# Patient Record
Sex: Female | Born: 1960 | Race: White | Hispanic: No | Marital: Single | Smoking: Never smoker
Health system: Southern US, Community
[De-identification: ages and names within clinical notes are randomized; demographics above are authoritative.]

## PROBLEM LIST (undated history)

## (undated) DIAGNOSIS — F32A Depression, unspecified: Secondary | ICD-10-CM

## (undated) DIAGNOSIS — F329 Major depressive disorder, single episode, unspecified: Secondary | ICD-10-CM

## (undated) DIAGNOSIS — K760 Fatty (change of) liver, not elsewhere classified: Secondary | ICD-10-CM

## (undated) DIAGNOSIS — K219 Gastro-esophageal reflux disease without esophagitis: Secondary | ICD-10-CM

## (undated) DIAGNOSIS — R519 Headache, unspecified: Secondary | ICD-10-CM

## (undated) DIAGNOSIS — R55 Syncope and collapse: Secondary | ICD-10-CM

## (undated) DIAGNOSIS — F419 Anxiety disorder, unspecified: Secondary | ICD-10-CM

## (undated) DIAGNOSIS — Z87828 Personal history of other (healed) physical injury and trauma: Secondary | ICD-10-CM

## (undated) DIAGNOSIS — G4730 Sleep apnea, unspecified: Secondary | ICD-10-CM

## (undated) DIAGNOSIS — M539 Dorsopathy, unspecified: Secondary | ICD-10-CM

## (undated) DIAGNOSIS — M1990 Unspecified osteoarthritis, unspecified site: Secondary | ICD-10-CM

## (undated) DIAGNOSIS — R51 Headache: Secondary | ICD-10-CM

## (undated) HISTORY — PX: TONSILLECTOMY: SUR1361

## (undated) HISTORY — DX: Personal history of other (healed) physical injury and trauma: Z87.828

## (undated) HISTORY — DX: Syncope and collapse: R55

## (undated) HISTORY — PX: LAPAROSCOPIC OVARIAN CYSTECTOMY: SUR786

## (undated) HISTORY — PX: BREAST BIOPSY: SHX20

---

## 2014-02-07 ENCOUNTER — Ambulatory Visit: Payer: Self-pay | Admitting: Internal Medicine

## 2014-03-10 ENCOUNTER — Ambulatory Visit: Payer: Self-pay | Admitting: Unknown Physician Specialty

## 2014-03-14 LAB — PATHOLOGY REPORT

## 2015-02-09 ENCOUNTER — Other Ambulatory Visit: Payer: Self-pay | Admitting: Physician Assistant

## 2015-02-09 DIAGNOSIS — R110 Nausea: Secondary | ICD-10-CM

## 2015-02-09 DIAGNOSIS — R5381 Other malaise: Secondary | ICD-10-CM

## 2015-02-09 DIAGNOSIS — R5383 Other fatigue: Secondary | ICD-10-CM

## 2015-02-09 DIAGNOSIS — R1011 Right upper quadrant pain: Secondary | ICD-10-CM

## 2015-02-13 ENCOUNTER — Ambulatory Visit: Payer: Self-pay

## 2015-02-21 ENCOUNTER — Ambulatory Visit
Admission: RE | Admit: 2015-02-21 | Discharge: 2015-02-21 | Disposition: A | Discharge status: Home / Self Care | Payer: BLUE CROSS/BLUE SHIELD | Source: Ambulatory Visit | Attending: Physician Assistant | Admitting: Physician Assistant

## 2015-02-21 DIAGNOSIS — R5381 Other malaise: Secondary | ICD-10-CM

## 2015-02-21 DIAGNOSIS — R5383 Other fatigue: Secondary | ICD-10-CM

## 2015-02-21 DIAGNOSIS — R1011 Right upper quadrant pain: Secondary | ICD-10-CM | POA: Insufficient documentation

## 2015-02-21 DIAGNOSIS — K769 Liver disease, unspecified: Secondary | ICD-10-CM | POA: Insufficient documentation

## 2015-02-21 DIAGNOSIS — R110 Nausea: Secondary | ICD-10-CM | POA: Diagnosis present

## 2015-06-18 ENCOUNTER — Encounter: Payer: Self-pay | Admitting: Emergency Medicine

## 2015-06-18 DIAGNOSIS — R1013 Epigastric pain: Secondary | ICD-10-CM | POA: Diagnosis not present

## 2015-06-18 DIAGNOSIS — Z91040 Latex allergy status: Secondary | ICD-10-CM | POA: Diagnosis not present

## 2015-06-18 DIAGNOSIS — R1011 Right upper quadrant pain: Secondary | ICD-10-CM | POA: Insufficient documentation

## 2015-06-18 NOTE — ED Notes (Signed)
Patient ambulatory to triage with steady gait, without difficulty or distress noted; pt reports dx with fatty liver disease 87mo ago by PCP; c/o increasing upper right quad pain radiating into lower abdomen; pt denies any accomp symptoms

## 2015-06-19 ENCOUNTER — Emergency Department: Payer: BLUE CROSS/BLUE SHIELD

## 2015-06-19 ENCOUNTER — Emergency Department
Admission: EM | Admit: 2015-06-19 | Discharge: 2015-06-19 | Disposition: A | Discharge status: Home / Self Care | Payer: BLUE CROSS/BLUE SHIELD | Attending: Emergency Medicine | Admitting: Emergency Medicine

## 2015-06-19 DIAGNOSIS — R1013 Epigastric pain: Secondary | ICD-10-CM

## 2015-06-19 DIAGNOSIS — R1011 Right upper quadrant pain: Secondary | ICD-10-CM

## 2015-06-19 DIAGNOSIS — R109 Unspecified abdominal pain: Secondary | ICD-10-CM

## 2015-06-19 HISTORY — DX: Fatty (change of) liver, not elsewhere classified: K76.0

## 2015-06-19 LAB — COMPREHENSIVE METABOLIC PANEL
ALBUMIN: 4.1 g/dL (ref 3.5–5.0)
ALT: 23 U/L (ref 14–54)
ANION GAP: 9 (ref 5–15)
AST: 27 U/L (ref 15–41)
Alkaline Phosphatase: 81 U/L (ref 38–126)
BUN: 20 mg/dL (ref 6–20)
CHLORIDE: 105 mmol/L (ref 101–111)
CO2: 26 mmol/L (ref 22–32)
Calcium: 9.7 mg/dL (ref 8.9–10.3)
Creatinine, Ser: 0.78 mg/dL (ref 0.44–1.00)
GFR calc Af Amer: >60 mL/min (ref >60)
GFR calc non Af Amer: >60 mL/min (ref >60)
GLUCOSE: 117 mg/dL — AB (ref 65–99)
POTASSIUM: 3.4 mmol/L — AB (ref 3.5–5.1)
SODIUM: 140 mmol/L (ref 135–145)
TOTAL PROTEIN: 7.4 g/dL (ref 6.5–8.1)
Total Bilirubin: 0.1 mg/dL — ABNORMAL LOW (ref 0.3–1.2)

## 2015-06-19 LAB — URINALYSIS COMPLETE WITH MICROSCOPIC (ARMC ONLY)
BILIRUBIN URINE: NEGATIVE
GLUCOSE, UA: NEGATIVE mg/dL
Ketones, ur: NEGATIVE mg/dL
Leukocytes, UA: NEGATIVE
NITRITE: NEGATIVE
PH: 5 (ref 5.0–8.0)
Protein, ur: NEGATIVE mg/dL
Specific Gravity, Urine: 1.02 (ref 1.005–1.030)

## 2015-06-19 LAB — CBC
HEMATOCRIT: 43.5 % (ref 35.0–47.0)
HEMOGLOBIN: 14.7 g/dL (ref 12.0–16.0)
MCH: 29.1 pg (ref 26.0–34.0)
MCHC: 33.7 g/dL (ref 32.0–36.0)
MCV: 86.2 fL (ref 80.0–100.0)
Platelets: 227 10*3/uL (ref 150–440)
RBC: 5.04 MIL/uL (ref 3.80–5.20)
RDW: 12.3 % (ref 11.5–14.5)
WBC: 11.2 10*3/uL — ABNORMAL HIGH (ref 3.6–11.0)

## 2015-06-19 LAB — LIPASE, BLOOD: LIPASE: 19 U/L — AB (ref 22–51)

## 2015-06-19 LAB — TROPONIN I

## 2015-06-19 MED ORDER — GI COCKTAIL ~~LOC~~
30.0000 mL | Freq: Once | ORAL | Status: AC
  Administered 2015-06-19: 30 mL via ORAL
  Filled 2015-06-19: qty 30

## 2015-06-19 MED ORDER — SUCRALFATE 1 G PO TABS
1.0000 g | ORAL_TABLET | Freq: Two times a day (BID) | ORAL | Status: DC
Start: 2015-06-19 — End: 2015-08-08

## 2015-06-19 NOTE — ED Provider Notes (Signed)
[facility] Emergency Department Provider Note  ____________________________________________  Time seen: Approximately 245 AM  I have reviewed the triage vital signs and the nursing notes.   HISTORY  Chief Complaint Abdominal Pain    HPI Elizabeth Oliver is a 54 y.o. female who comes in with 3 weeks of abdominal pain. The patient reports that back in May she was told that she had a fatty liver after having 2 weeks of abdominal pain. The patient reports that it got better for 3 months. She reports that 3 weeks ago this pain started up again in her right upper quadrant with some burning in her upper abdomen and some occasional burning in her lower abdomen. The patient reports that sometime she has this right upper quadrant pain when she takes a deep breath as well. The patient is concerned about cirrhosis or cancer. She has an appointment with her primary care physician in the morning but reports that she was nervous she decided to come in for evaluation. The patient reports that the pain is occasionally worse after eating and she has not taken anything for the pain. The patient reports that sometimes it'll go away for a short amount of time but is not often. She reports the pain is currently a 6 out of 10 in intensity.   Past Medical History  Diagnosis Date  . Fatty liver     There are no active problems to display for this patient.   Past Surgical History  Procedure Laterality Date  . Tonsillectomy    . Laparoscopic ovarian cystectomy      Current Outpatient Rx  Name  Route  Sig  Dispense  Refill  . sucralfate (CARAFATE) 1 G tablet   Oral   Take 1 tablet (1 g total) by mouth 2 (two) times daily.   30 tablet   0     Allergies Latex  No family history on file.  Social History Social History  Substance Use Topics  . Smoking status: Not on file  . Smokeless tobacco: Not on file  . Alcohol Use: Not on file    Review of  Systems Constitutional: No fever/chills Eyes: No visual changes. ENT: No sore throat. Cardiovascular: Denies chest pain. Respiratory: Denies shortness of breath. Gastrointestinal: abdominal pain.  No nausea, no vomiting.  No diarrhea.  No constipation. Genitourinary: Negative for dysuria. Musculoskeletal: Negative for back pain. Skin: Negative for rash. Neurological: Negative for headaches, focal weakness or numbness.  10-point ROS otherwise negative.  ____________________________________________   PHYSICAL EXAM:  VITAL SIGNS: ED Triage Vitals  Enc Vitals Group     BP 06/18/15 2325 155/104 mmHg     Pulse Rate 06/18/15 2325 100     Resp --      Temp 06/18/15 2325 98.7 F (37.1 C)     Temp Source 06/18/15 2325 Oral     SpO2 06/18/15 2325 96 %     Weight 06/18/15 2325 221 lb (100.245 kg)     Height 06/18/15 2325 5\' 7"  (1.702 m)     Head Cir --      Peak Flow --      Pain Score 06/18/15 2325 6     Pain Loc --      Pain Edu? --      Excl. in [place]? --     Constitutional: Alert and oriented. Well appearing and in no acute distress. Eyes: Conjunctivae are normal. PERRL. EOMI. Head: Atraumatic. Nose: No congestion/rhinnorhea. Mouth/Throat: Mucous membranes are moist.  Oropharynx non-erythematous. Cardiovascular: Normal rate, regular rhythm. Grossly normal heart sounds.  Good peripheral circulation. Respiratory: Normal respiratory effort.  No retractions. Lungs CTAB. Gastrointestinal: Soft right upper quadrant tenderness to palpation. No distention. Active bowel sounds Musculoskeletal: No lower extremity tenderness nor edema.  No joint effusions. Neurologic:  Normal speech and language.  Skin:  Skin is warm, dry and intact. No rash noted. Psychiatric: Mood and affect are normal.   ____________________________________________   LABS (all labs ordered are listed, but only abnormal results are displayed)  Labs Reviewed  LIPASE, BLOOD - Abnormal; Notable for the following:     Lipase 19 (*)    All other components within normal limits  COMPREHENSIVE METABOLIC PANEL - Abnormal; Notable for the following:    Potassium 3.4 (*)    Glucose, Bld 117 (*)    Total Bilirubin 0.1 (*)    All other components within normal limits  CBC - Abnormal; Notable for the following:    WBC 11.2 (*)    All other components within normal limits  URINALYSIS COMPLETEWITH MICROSCOPIC ([facility] ONLY) - Abnormal; Notable for the following:    Color, Urine YELLOW (*)    APPearance CLEAR (*)    Hgb urine dipstick 1+ (*)    Bacteria, UA RARE (*)    Squamous Epithelial / LPF 0-5 (*)    All other components within normal limits  TROPONIN I   ____________________________________________  EKG  ED ECG REPORT I, Loney Hering, the attending physician, personally viewed and interpreted this ECG.   Date: 06/19/2015  EKG Time: 0009  Rate: 84  Rhythm: normal sinus rhythm  Axis: normal  Intervals:none  ST&T Change: none  ____________________________________________  RADIOLOGY  Right upper quadrant ultrasound: Normal examination ____________________________________________   PROCEDURES  Procedure(s) performed: None  Critical Care performed: No  ____________________________________________   INITIAL IMPRESSION / ASSESSMENT AND PLAN / ED COURSE  Pertinent labs & imaging results that were available during my care of the patient were reviewed by me and considered in my medical decision making (see chart for details).  This is a 54 year old female who has had 3 weeks of right upper quadrant abdominal pain. The patient has been recently told she had fatty liver was concerned so she decided to come in for evaluation. The patient's ultrasound is unremarkable. I gave the patient GI cocktail and her pain was improved. I discussed with the patient the possibility of gastroesophageal reflux disease or an ulcer. The patient reports that she does drink 4 cups of coffee a day. I will  discharge the patient home with some Carafate and have her follow-up with Dr. Ouida Sills as scheduled tomorrow and then further follow-up with GI. The patient had no further complaints or concerns be discharged home. ____________________________________________   FINAL CLINICAL IMPRESSION(S) / ED DIAGNOSES  Final diagnoses:  Abdominal pain  RUQ abdominal pain  Epigastric abdominal pain      Loney Hering, MD 06/19/15 [phone]

## 2015-06-19 NOTE — Discharge Instructions (Signed)

## 2015-08-07 NOTE — H&P (Addendum)
   Chief Complaint:    Ms. Neuwirth is a 54 y.o. female here for Follow-up (PMB - Ultrasound) .  Patient presents for evaluation of postmenopausal bleeding:   Ultrasound today with thickened endometrial stripe to 8.6mm Two fibroids seen rt lat 1=27.7 mm 2 pedunculated=22.3 mm Endometrium=8.71 mm Abn postmenopause bil ovs wnl  EMBx unsuccessful because of cervical stenosis  Past Medical History:  has a past medical history of Arthritis; GERD (gastroesophageal reflux disease); and Multilevel degenerative disc disease.  Past Surgical History:  has a past surgical history that includes Colonoscopy (02/15/2002); Colonoscopy (03/10/2014); Tonsillectomy; Laparoscopic ovarian cystectomy; and cystectomy ovary. Family History: family history is not on file. Social History:  reports that she has never smoked. She has never used smokeless tobacco. She reports that she drinks alcohol. OB/GYN History:     OB History    No data available      Allergies: is allergic to latex. Medications:  Current Outpatient Prescriptions:  . pantoprazole (PROTONIX) 40 MG DR tablet, Take 1 tablet (40 mg total) by mouth once daily. (Patient not taking: Reported on 08/06/2015 ), Disp: 30 tablet, Rfl: 11   Review of Systems: General:   No fatigue or weight loss Eyes:   No vision changes Ears:   No hearing difficulty Respiratory:   No cough or shortness of breath Pulmonary:   No asthma or shortness of breath Cardiovascular:  No chest pain, palpitations, dyspnea on exertion Gastrointestinal:  No abdominal bloating, chronic diarrhea, constipations, masses, pain or hematochezia Genitourinary:  See HPI Lymphatic:  No swollen lymph nodes Musculoskeletal: No muscle weakness Neurologic:  No extremity weakness, syncope, seizure disorder Psychiatric:  No history of depression, delusions or suicidal/homicidal ideation   Exam:      Vitals:   08/06/15 1616  BP: (!) 146/92  Pulse:  92   Body mass index is 34.61 kg/(m^2).  WDWN white female in NAD Lungs: CTA  CV : RRR without murmur  Breast: deferred Neck: no thyromegaly Abdomen: soft , no mass, normal active bowel sounds, non-tender, no rebound tenderness Skin: No rashes, ulcers or skin lesions noted. No excessive hirsutism or acne noted.  Neurological: Appears alert and oriented and is a good historian. No gross abnormalities are noted. Psychological: Normal affect and mood. No signs of anxiety or depression noted. Appropriate anxiety surrounding testing  Pelvic:  Deferred    Impression:   The primary encounter diagnosis was Post-menopause bleeding. A diagnosis of Endometrial thickening on ultra sound was also pertinent to this visit.    Plan:   - Preoperative visit: D&C hysteroscopy, possible polypectomy, myomectomy. No SIS because of cervical stenosis. Consents signed today. Risks of surgery were discussed with the patient including but not limited to: bleeding which may require transfusion; infection which may require antibiotics; injury to uterus or surrounding organs; intrauterine scarring which may impair future fertility; need for additional procedures including laparotomy or laparoscopy; and other postoperative/anesthesia complications. Written informed consent was obtained.  This is a scheduled same-day surgery. She will have a postop visit in 2 weeks to review operative findings and pathology.  -

## 2015-08-08 ENCOUNTER — Encounter
Admission: RE | Admit: 2015-08-08 | Discharge: 2015-08-08 | Disposition: A | Discharge status: Home / Self Care | Payer: BLUE CROSS/BLUE SHIELD | Source: Ambulatory Visit | Attending: Obstetrics and Gynecology | Admitting: Obstetrics and Gynecology

## 2015-08-08 DIAGNOSIS — N950 Postmenopausal bleeding: Secondary | ICD-10-CM | POA: Diagnosis not present

## 2015-08-08 DIAGNOSIS — Q512 Other doubling of uterus: Secondary | ICD-10-CM | POA: Diagnosis not present

## 2015-08-08 DIAGNOSIS — N882 Stricture and stenosis of cervix uteri: Secondary | ICD-10-CM | POA: Diagnosis not present

## 2015-08-08 DIAGNOSIS — Z91040 Latex allergy status: Secondary | ICD-10-CM | POA: Diagnosis not present

## 2015-08-08 DIAGNOSIS — E669 Obesity, unspecified: Secondary | ICD-10-CM | POA: Diagnosis not present

## 2015-08-08 DIAGNOSIS — Z6834 Body mass index (BMI) 34.0-34.9, adult: Secondary | ICD-10-CM | POA: Diagnosis not present

## 2015-08-08 DIAGNOSIS — D259 Leiomyoma of uterus, unspecified: Secondary | ICD-10-CM | POA: Diagnosis not present

## 2015-08-08 DIAGNOSIS — M1990 Unspecified osteoarthritis, unspecified site: Secondary | ICD-10-CM | POA: Diagnosis not present

## 2015-08-08 DIAGNOSIS — K219 Gastro-esophageal reflux disease without esophagitis: Secondary | ICD-10-CM | POA: Diagnosis not present

## 2015-08-08 HISTORY — DX: Gastro-esophageal reflux disease without esophagitis: K21.9

## 2015-08-08 HISTORY — DX: Dorsopathy, unspecified: M53.9

## 2015-08-08 HISTORY — DX: Headache, unspecified: R51.9

## 2015-08-08 HISTORY — DX: Unspecified osteoarthritis, unspecified site: M19.90

## 2015-08-08 HISTORY — DX: Headache: R51

## 2015-08-08 HISTORY — DX: Major depressive disorder, single episode, unspecified: F32.9

## 2015-08-08 HISTORY — DX: Anxiety disorder, unspecified: F41.9

## 2015-08-08 HISTORY — DX: Depression, unspecified: F32.A

## 2015-08-08 LAB — CBC
HEMATOCRIT: 45.2 % (ref 35.0–47.0)
Hemoglobin: 15.1 g/dL (ref 12.0–16.0)
MCH: 29.2 pg (ref 26.0–34.0)
MCHC: 33.3 g/dL (ref 32.0–36.0)
MCV: 87.6 fL (ref 80.0–100.0)
PLATELETS: 220 10*3/uL (ref 150–440)
RBC: 5.17 MIL/uL (ref 3.80–5.20)
RDW: 12.6 % (ref 11.5–14.5)
WBC: 7.4 10*3/uL (ref 3.6–11.0)

## 2015-08-08 LAB — TYPE AND SCREEN
ABO/RH(D): O POS
ANTIBODY SCREEN: NEGATIVE

## 2015-08-08 LAB — BASIC METABOLIC PANEL
Anion gap: 9 (ref 5–15)
BUN: 18 mg/dL (ref 6–20)
CO2: 27 mmol/L (ref 22–32)
CREATININE: 0.76 mg/dL (ref 0.44–1.00)
Calcium: 9.8 mg/dL (ref 8.9–10.3)
Chloride: 104 mmol/L (ref 101–111)
GFR calc Af Amer: >60 mL/min (ref >60)
GLUCOSE: 105 mg/dL — AB (ref 65–99)
POTASSIUM: 3.9 mmol/L (ref 3.5–5.1)
SODIUM: 140 mmol/L (ref 135–145)

## 2015-08-08 LAB — ABO/RH: ABO/RH(D): O POS

## 2015-08-08 NOTE — Pre-Procedure Instructions (Signed)
Dr. Laureen Abrahams okayed previous EKG  from  September. No clearance needed.

## 2015-08-08 NOTE — Patient Instructions (Addendum)
  Your procedure is scheduled on: August 10, 2015(Friday) Report to Day Surgery.[facility]) To find out your arrival time please call [phone] between 1PM - 3PM on August 09, 2015(Thursday).  Remember: Instructions that are not followed completely may result in serious medical risk, up to and including death, or upon the discretion of your surgeon and anesthesiologist your surgery may need to be rescheduled.    _x___ 1. Do not eat food or drink liquids after midnight. No gum chewing or hard candies.     ____ 2. No Alcohol for 24 hours before or after surgery.   ____ 3. Bring all medications with you on the day of surgery if instructed.    __x_ 4. Notify your doctor if there is any change in your medical condition     (cold, fever, infections).     Do not wear jewelry, make-up, hairpins, clips or nail polish.  Do not wear lotions, powders, or perfumes. You may wear deodorant.  Do not shave 48 hours prior to surgery. Men may shave face and neck.  Do not bring valuables to the hospital.    [facility] is not responsible for any belongings or valuables.               Contacts, dentures or bridgework may not be worn into surgery.  Leave your suitcase in the car. After surgery it may be brought to your room.  For patients admitted to the hospital, discharge time is determined by your                treatment team.   Patients discharged the day of surgery will not be allowed to drive home.   Please read over the following fact sheets that you were given:      ____ Take these medicines the morning of surgery with A SIP OF WATER:    1.   2.   3.   4.  5.  6.  ____ Fleet Enema (as directed)   _x___ Use CHG Soap as directed  ____ Use inhalers on the day of surgery  ____ Stop metformin 2 days prior to surgery    ____ Take 1/2 of usual insulin dose the night before surgery and none on the morning of surgery.   ____ Stop Coumadin/Plavix/aspirin on   __x__ Stop  Anti-inflammatories on (TYLENOL OK TO TAKE FOR PAIN IF NEEDED)   ____ Stop supplements until after surgery.    ____ Bring C-Pap to the hospital.

## 2015-08-10 ENCOUNTER — Ambulatory Visit: Payer: BLUE CROSS/BLUE SHIELD | Admitting: Anesthesiology

## 2015-08-10 ENCOUNTER — Ambulatory Visit
Admission: RE | Admit: 2015-08-10 | Discharge: 2015-08-10 | Disposition: A | Discharge status: Home / Self Care | Payer: BLUE CROSS/BLUE SHIELD | Source: Ambulatory Visit | Attending: Obstetrics and Gynecology | Admitting: Obstetrics and Gynecology

## 2015-08-10 ENCOUNTER — Encounter
Admission: RE | Disposition: A | Discharge status: Home / Self Care | Payer: Self-pay | Source: Ambulatory Visit | Attending: Obstetrics and Gynecology

## 2015-08-10 ENCOUNTER — Encounter: Payer: Self-pay | Admitting: *Deleted

## 2015-08-10 DIAGNOSIS — M1990 Unspecified osteoarthritis, unspecified site: Secondary | ICD-10-CM | POA: Insufficient documentation

## 2015-08-10 DIAGNOSIS — K219 Gastro-esophageal reflux disease without esophagitis: Secondary | ICD-10-CM | POA: Insufficient documentation

## 2015-08-10 DIAGNOSIS — N882 Stricture and stenosis of cervix uteri: Secondary | ICD-10-CM | POA: Insufficient documentation

## 2015-08-10 DIAGNOSIS — N950 Postmenopausal bleeding: Secondary | ICD-10-CM | POA: Insufficient documentation

## 2015-08-10 DIAGNOSIS — Z91040 Latex allergy status: Secondary | ICD-10-CM | POA: Insufficient documentation

## 2015-08-10 DIAGNOSIS — Z6834 Body mass index (BMI) 34.0-34.9, adult: Secondary | ICD-10-CM | POA: Insufficient documentation

## 2015-08-10 DIAGNOSIS — Q512 Other doubling of uterus: Secondary | ICD-10-CM | POA: Insufficient documentation

## 2015-08-10 DIAGNOSIS — D259 Leiomyoma of uterus, unspecified: Secondary | ICD-10-CM | POA: Insufficient documentation

## 2015-08-10 DIAGNOSIS — E669 Obesity, unspecified: Secondary | ICD-10-CM | POA: Insufficient documentation

## 2015-08-10 HISTORY — PX: HYSTEROSCOPY WITH D & C: SHX1775

## 2015-08-10 SURGERY — DILATATION AND CURETTAGE /HYSTEROSCOPY
Anesthesia: General | Wound class: Clean Contaminated

## 2015-08-10 MED ORDER — KETOROLAC TROMETHAMINE 30 MG/ML IJ SOLN
INTRAMUSCULAR | Status: DC | PRN
  Administered 2015-08-10: 30 mg via INTRAVENOUS

## 2015-08-10 MED ORDER — FAMOTIDINE 20 MG PO TABS
20.0000 mg | ORAL_TABLET | Freq: Once | ORAL | Status: AC
Start: 2015-08-10 — End: 2015-08-10
  Administered 2015-08-10: 20 mg via ORAL

## 2015-08-10 MED ORDER — PROPOFOL 10 MG/ML IV BOLUS
INTRAVENOUS | Status: DC | PRN
  Administered 2015-08-10: 180 mg via INTRAVENOUS

## 2015-08-10 MED ORDER — ACETAMINOPHEN 10 MG/ML IV SOLN
INTRAVENOUS | Status: AC
  Filled 2015-08-10: qty 100

## 2015-08-10 MED ORDER — OXYCODONE-ACETAMINOPHEN 5-325 MG PO TABS
1.0000 | ORAL_TABLET | Freq: Four times a day (QID) | ORAL | Status: DC | PRN
Start: 2015-08-10 — End: 2015-12-12

## 2015-08-10 MED ORDER — DOCUSATE SODIUM 100 MG PO CAPS
100.0000 mg | ORAL_CAPSULE | Freq: Two times a day (BID) | ORAL | Status: DC | PRN
  Filled 2015-08-10: qty 1

## 2015-08-10 MED ORDER — HYDROMORPHONE HCL 1 MG/ML IJ SOLN: 0.2500 mg | INTRAMUSCULAR | Status: DC | PRN

## 2015-08-10 MED ORDER — FAMOTIDINE 20 MG PO TABS
ORAL_TABLET | ORAL | Status: AC
  Administered 2015-08-10: 20 mg via ORAL
  Filled 2015-08-10: qty 1

## 2015-08-10 MED ORDER — LACTATED RINGERS IV SOLN
INTRAVENOUS | Status: DC
  Administered 2015-08-10: 07:00:00 via INTRAVENOUS

## 2015-08-10 MED ORDER — MIDAZOLAM HCL 5 MG/5ML IJ SOLN
INTRAMUSCULAR | Status: DC | PRN
  Administered 2015-08-10: 2 mg via INTRAVENOUS

## 2015-08-10 MED ORDER — FENTANYL CITRATE (PF) 100 MCG/2ML IJ SOLN
INTRAMUSCULAR | Status: DC | PRN
  Administered 2015-08-10 (×2): 100 ug via INTRAVENOUS

## 2015-08-10 MED ORDER — ACETAMINOPHEN 10 MG/ML IV SOLN
INTRAVENOUS | Status: DC | PRN
  Administered 2015-08-10: 1000 mg via INTRAVENOUS

## 2015-08-10 MED ORDER — DOCUSATE SODIUM 100 MG PO CAPS: 100.0000 mg | ORAL_CAPSULE | Freq: Two times a day (BID) | ORAL | Status: DC | PRN

## 2015-08-10 MED ORDER — LIDOCAINE HCL (CARDIAC) 20 MG/ML IV SOLN
INTRAVENOUS | Status: DC | PRN
  Administered 2015-08-10: 80 mg via INTRAVENOUS

## 2015-08-10 MED ORDER — IBUPROFEN 800 MG PO TABS: 800.0000 mg | ORAL_TABLET | Freq: Three times a day (TID) | ORAL | Status: DC | PRN

## 2015-08-10 MED ORDER — ONDANSETRON 4 MG PO TBDP: 4.0000 mg | ORAL_TABLET | Freq: Four times a day (QID) | ORAL | Status: DC | PRN

## 2015-08-10 MED ORDER — ONDANSETRON HCL 4 MG/2ML IJ SOLN: 4.0000 mg | Freq: Once | INTRAMUSCULAR | Status: DC | PRN

## 2015-08-10 SURGICAL SUPPLY — 46 items
ABLATOR ENDOMETRIAL MYOSURE (ABLATOR) ×2 IMPLANT
CANISTER SUCT 1200ML W/VALVE (MISCELLANEOUS) ×2 IMPLANT
CANISTER SUCT 3000ML (MISCELLANEOUS) ×2 IMPLANT
CATH ROBINSON RED A/P 16FR (CATHETERS) IMPLANT
CATH TRAY 16F METER LATEX (MISCELLANEOUS) ×2 IMPLANT
CHLORAPREP W/TINT 26ML (MISCELLANEOUS) ×2 IMPLANT
CORD URO TURP 10FT (MISCELLANEOUS) IMPLANT
DRAPE LAP W/FLUID (DRAPES) ×2 IMPLANT
DRAPE UNDER BUTTOCK W/FLU (DRAPES) ×2 IMPLANT
DRSG TELFA 3X8 NADH (GAUZE/BANDAGES/DRESSINGS) IMPLANT
ELECT CAUTERY BLADE 6.4 (BLADE) IMPLANT
ELECT LOOP MED HF 24F 12D (CUTTING LOOP) IMPLANT
ELECT RESECT POWERBALL 24F (MISCELLANEOUS) IMPLANT
GAUZE SPONGE 4X4 12PLY STRL (GAUZE/BANDAGES/DRESSINGS) IMPLANT
GLOVE BIO SURGEON STRL SZ 6.5 (GLOVE) ×2 IMPLANT
GLOVE INDICATOR 7.0 STRL GRN (GLOVE) ×2 IMPLANT
GOWN STRL REUS W/ TWL LRG LVL3 (GOWN DISPOSABLE) ×2 IMPLANT
GOWN STRL REUS W/ TWL XL LVL3 (GOWN DISPOSABLE) IMPLANT
GOWN STRL REUS W/TWL LRG LVL3 (GOWN DISPOSABLE) ×2
GOWN STRL REUS W/TWL XL LVL3 (GOWN DISPOSABLE)
IV LACTATED RINGERS 1000ML (IV SOLUTION) ×4 IMPLANT
KIT RM TURNOVER CYSTO AR (KITS) ×2 IMPLANT
LABEL OR SOLS (LABEL) IMPLANT
MYOSURE LITE POLYP REMOVAL (MISCELLANEOUS) ×2 IMPLANT
PACK BASIN MAJOR ARMC (MISCELLANEOUS) ×2 IMPLANT
PACK DNC HYST (MISCELLANEOUS) ×2 IMPLANT
PAD GROUND ADULT SPLIT (MISCELLANEOUS) ×2 IMPLANT
PAD OB MATERNITY 4.3X12.25 (PERSONAL CARE ITEMS) ×2 IMPLANT
PAD PREP 24X41 OB/GYN DISP (PERSONAL CARE ITEMS) ×2 IMPLANT
SEAL ROD LENS SCOPE MYOSURE (ABLATOR) ×2 IMPLANT
SOL PREP PVP 2OZ (MISCELLANEOUS) ×2
SOLUTION PREP PVP 2OZ (MISCELLANEOUS) ×1 IMPLANT
SPONGE XRAY 4X4 16PLY STRL (MISCELLANEOUS) ×2 IMPLANT
STAPLER SKIN PROX 35W (STAPLE) IMPLANT
SURGILUBE 2OZ TUBE FLIPTOP (MISCELLANEOUS) ×2 IMPLANT
SUT VIC AB 0 CT1 27 (SUTURE) ×3
SUT VIC AB 0 CT1 27XCR 8 STRN (SUTURE) ×3 IMPLANT
SUT VIC AB 0 CT1 36 (SUTURE) ×2 IMPLANT
SUT VIC AB 2-0 SH 27 (SUTURE)
SUT VIC AB 2-0 SH 27XBRD (SUTURE) IMPLANT
SUT VICRYL PLUS ABS 0 54 (SUTURE) IMPLANT
SYR BULB IRRIG 60ML STRL (SYRINGE) ×2 IMPLANT
TRAY PREP VAG/GEN (MISCELLANEOUS) ×2 IMPLANT
TUBING CONNECTING 10 (TUBING) ×2 IMPLANT
TUBING HYSTEROSCOPY DOLPHIN (MISCELLANEOUS) ×2 IMPLANT
WATER STERILE IRR 1000ML POUR (IV SOLUTION) IMPLANT

## 2015-08-10 NOTE — Transfer of Care (Signed)
Immediate Anesthesia Transfer of Care Note  Patient: Elizabeth Oliver  Procedure(s) Performed: Procedure(s): DILATATION AND CURETTAGE /HYSTEROSCOPY (N/A) MYOMECTOMY (N/A)  Patient Location: PACU  Anesthesia Type:General  Level of Consciousness: sedated  Airway & Oxygen Therapy: Patient Spontanous Breathing and Patient connected to face mask oxygen  Post-op Assessment: Report given to RN and Post -op Vital signs reviewed and stable  Post vital signs: Reviewed and stable  Last Vitals: 9:07 103 hr 97% sat 129/76 23resp temp 99.2 Filed Vitals:   08/10/15 0614  BP: 156/92  Pulse: 97  Temp: 36.8 C  Resp: 16    Complications: No apparent anesthesia complications

## 2015-08-10 NOTE — Interval H&P Note (Signed)
History and Physical Interval Note:  08/10/2015 7:31 AM  Elizabeth Oliver  has presented today for surgery, with the diagnosis of PMB,THICK STRIPE  The various methods of treatment have been discussed with the patient and family. After consideration of risks, benefits and other options for treatment, the patient has consented to  Procedure(s): DILATATION AND CURETTAGE /HYSTEROSCOPY (N/A) MYOMECTOMY (N/A) as a surgical intervention .  The patient's history has been reviewed, patient examined, no change in status, stable for surgery.  I have reviewed the patient's chart and labs.  Questions were answered to the patient's satisfaction.     Benjaman Kindler

## 2015-08-10 NOTE — Anesthesia Preprocedure Evaluation (Signed)
Anesthesia Evaluation  Patient identified by MRN, date of birth, ID band Patient awake    Reviewed: Allergy & Precautions, NPO status , Patient's Chart, lab work & pertinent test results  Airway Mallampati: III  TM Distance: >3 FB Neck ROM: Full    Dental  (+) Teeth Intact   Pulmonary    Pulmonary exam normal breath sounds clear to auscultation       Cardiovascular Exercise Tolerance: Good Normal cardiovascular exam Rhythm:Regular Rate:Normal     Neuro/Psych    GI/Hepatic GERD  Medicated and Controlled,  Endo/Other    Renal/GU      Musculoskeletal   Abdominal (+) + obese,  Abdomen: soft.    Peds  Hematology   Anesthesia Other Findings   Reproductive/Obstetrics                             Anesthesia Physical Anesthesia Plan  ASA: II  Anesthesia Plan: General   Post-op Pain Management:    Induction: Intravenous  Airway Management Planned: LMA  Additional Equipment:   Intra-op Plan:   Post-operative Plan: Extubation in OR  Informed Consent: I have reviewed the patients History and Physical, chart, labs and discussed the procedure including the risks, benefits and alternatives for the proposed anesthesia with the patient or authorized representative who has indicated his/her understanding and acceptance.     Plan Discussed with: CRNA  Anesthesia Plan Comments:         Anesthesia Quick Evaluation

## 2015-08-10 NOTE — Anesthesia Procedure Notes (Signed)
Procedure Name: LMA Insertion Date/Time: 08/10/2015 7:45 AM Performed by: Delaney Meigs Pre-anesthesia Checklist: Patient identified, Emergency Drugs available, Suction available, Patient being monitored and Timeout performed Patient Re-evaluated:Patient Re-evaluated prior to inductionOxygen Delivery Method: Circle system utilized and Simple face mask Preoxygenation: Pre-oxygenation with 100% oxygen Intubation Type: IV induction Ventilation: Mask ventilation without difficulty LMA Size: 3.5 Number of attempts: 1 Placement Confirmation: breath sounds checked- equal and bilateral and positive ETCO2 Tube secured with: Tape

## 2015-08-10 NOTE — Op Note (Addendum)
Operative Report Hysteroscopy with Dilation and Curettage   Indications: Postmenopausal bleeding   Pre-operative Diagnosis: Thickened endometrial stripe; cervical stenosis   Post-operative Diagnosis: same.  Procedure: 1. Exam under anesthesia 2. Fractional D&C 3. Hysteroscopy  Surgeon: Benjaman Kindler, MD  Assistant(s):  None  Anesthesia: General LMA anesthesia  Estimated Blood Loss:  25         Intraoperative medications: Toradol         Total IV Fluids: 700 ml  Urine Output: 337ml  Fluid deficit: 200 ml  Specimens: Endocervical curettings, endometrial curettings         Complications:  None; patient tolerated the procedure well.         Disposition: PACU - hemodynamically stable.         Condition: stable  Findings: Uterus measuring 6 cm cm by sound; normal cervix, vagina, perineum. Very stenotic cervical os. Uterine septum noted that appeared thick, but was easily removed with simply uterine curettage. No septal resection with sharp instruments was undertaken. Septum was minimal at the conclusion of the case.   Indication for procedure/Consents: 54 y.o. G0 here for scheduled surgery for the aforementioned diagnoses.  Risks of surgery were discussed with the patient including but not limited to: bleeding which may require transfusion; infection which may require antibiotics; injury to uterus or surrounding organs; intrauterine scarring which may impair future fertility; need for additional procedures including laparotomy or laparoscopy; and other postoperative/anesthesia complications. Written informed consent was obtained.    Procedure Details:   The patient was taken to the operating room where general LMA anesthesia was administered and was found to be adequate. After a formal and adequate timeout was performed, she was placed in the dorsal lithotomy position and examined with the above findings. She was then prepped and draped in the sterile manner. Her bladder  was catheterized for an estimated amount of clear, yellow urine. A weighed speculum was then placed in the patient's vagina and a single tooth tenaculum was applied to the anterior lip of the cervix.  Her cervix was serially dilated, requiring lacrimal dilators to start, and the Myosure polypectomy device for guidance around the uterine septum, as the endometrial cavity was very small and the cervix very tight. The cervix was then serially dilated to 15 [place] using Hanks dilators.An ECC was performed. The hysteroscope was introduced to reveal the above findings. A sharp curettage was then performed until there was a gritty texture in all four quadrants. The tenaculum was removed from the anterior lip of the cervix and the vaginal speculum was removed after applying silver nitrate for good hemostasis. The patient tolerated the procedure well and was taken to the recovery area awake and in stable condition. She received iv acetaminophen and Toradol prior to leaving the OR.  The patient will be discharged to home as per PACU criteria. Routine postoperative instructions given. She was prescribed Ibuprofen and Colace. She will follow up in the clinic in two weeks for postoperative evaluation.

## 2015-08-10 NOTE — Anesthesia Postprocedure Evaluation (Signed)
  Anesthesia Post-op Note  Patient: Elizabeth Oliver  Procedure(s) Performed: Procedure(s): DILATATION AND CURETTAGE /HYSTEROSCOPY (N/A)  Anesthesia type:General  Patient location: PACU  Post pain: Pain level controlled  Post assessment: Post-op Vital signs reviewed, Patient's Cardiovascular Status Stable, Respiratory Function Stable, Patent Airway and No signs of Nausea or vomiting  Post vital signs: Reviewed and stable  Last Vitals:  Filed Vitals:   08/10/15 0905  BP:   Pulse:   Temp: 36.8 C  Resp:     Level of consciousness: awake, alert  and patient cooperative  Complications: No apparent anesthesia complications

## 2015-08-10 NOTE — Discharge Instructions (Signed)
Discharge instructions after a hysteroscopy with dilation and curettage  Signs and Symptoms to Report  Call our office at [phone] if you have any of the following:   . Fever over 100.4 degrees or higher . Severe stomach pain not relieved with pain medications . Bright red bleeding that's heavier than a period that does not slow with rest after the first 24 hours . To go the bathroom a lot (frequency), you can't hold your urine (urgency), or it hurts when you empty your bladder (urinate) . Chest pain . Shortness of breath . Pain in the calves of your legs . Severe nausea and vomiting not relieved with anti-nausea medications . Any concerns  What You Can Expect after Surgery . You may see some pink tinged, bloody fluid. This is normal. You may also have cramping for several days.   Activities after Your Discharge Follow these guidelines to help speed your recovery at home: . Don't drive if you are in pain or taking narcotic pain medicine. You may drive when you can safely slam on the brakes, turn the wheel forcefully, and rotate your torso comfortably. This is typically 4-7 days. Practice in a parking lot or side street prior to attempting to drive regularly.  . Ask others to help with household chores for 4 weeks. . Don't do strenuous activities, exercises, or sports like vacuuming, tennis, squash, etc. until your doctor says it is safe to do so. . Walk as you feel able. Rest often since it may take a week or two for your energy level to return to normal.  . You may climb stairs . Avoid constipation:   -Eat fruits, vegetables, and whole grains. Eat small meals as your appetite will take time to return to normal.   -Drink 6 to 8 glasses of water each day unless your doctor has told you to limit your fluids.   -Use a laxative or stool softener as needed if constipation becomes a problem. You may take Miralax, metamucil, Citrucil, Colace, Senekot, FiberCon, etc. If this does not  relieve the constipation, try two tablespoons of Milk Of Magnesia every 8 hours until your bowels move.  . You may shower.  . Do not get in a hot tub, swimming pool, etc. until your doctor agrees. . Do not douche, use tampons, or have sex until your doctor says it is okay, usually about 2 weeks. . Take your pain medicine when you need it. The medicine may not work as well if the pain is bad.  Take the medicines you were taking before surgery. Other medications you might need are pain medications (ibuprofen), medications for constipation (Colace) and nausea medications (Zofran).        AMBULATORY SURGERY  DISCHARGE INSTRUCTIONS   1) The drugs that you were given will stay in your system until tomorrow so for the next 24 hours you should not:  A) Drive an automobile B) Make any legal decisions C) Drink any alcoholic beverage   2) You may resume regular meals tomorrow.  Today it is better to start with liquids and gradually work up to solid foods.  You may eat anything you prefer, but it is better to start with liquids, then soup and crackers, and gradually work up to solid foods.   3) Please notify your doctor immediately if you have any unusual bleeding, trouble breathing, redness and pain at the surgery site, drainage, fever, or pain not relieved by medication.    4) Additional Instructions:          Please contact your physician with any problems or Same Day Surgery at [phone], Monday through Friday 6 am to 4 pm, or [facility] [place] [facility] number at [phone]. 

## 2015-08-13 LAB — SURGICAL PATHOLOGY

## 2015-11-20 ENCOUNTER — Emergency Department
Admission: EM | Admit: 2015-11-20 | Discharge: 2015-11-21 | Disposition: A | Discharge status: Home / Self Care | Payer: BLUE CROSS/BLUE SHIELD | Attending: Emergency Medicine | Admitting: Emergency Medicine

## 2015-11-20 ENCOUNTER — Encounter: Payer: Self-pay | Admitting: Emergency Medicine

## 2015-11-20 DIAGNOSIS — R55 Syncope and collapse: Secondary | ICD-10-CM | POA: Insufficient documentation

## 2015-11-20 DIAGNOSIS — Z91040 Latex allergy status: Secondary | ICD-10-CM | POA: Insufficient documentation

## 2015-11-20 DIAGNOSIS — R42 Dizziness and giddiness: Secondary | ICD-10-CM | POA: Diagnosis present

## 2015-11-20 LAB — URINALYSIS COMPLETE WITH MICROSCOPIC (ARMC ONLY)
BILIRUBIN URINE: NEGATIVE
Bacteria, UA: NONE SEEN
Glucose, UA: NEGATIVE mg/dL
Hgb urine dipstick: NEGATIVE
KETONES UR: NEGATIVE mg/dL
Leukocytes, UA: NEGATIVE
NITRITE: NEGATIVE
Protein, ur: NEGATIVE mg/dL
SPECIFIC GRAVITY, URINE: 1.01 (ref 1.005–1.030)
pH: 6 (ref 5.0–8.0)

## 2015-11-20 LAB — BASIC METABOLIC PANEL
ANION GAP: 6 (ref 5–15)
BUN: 15 mg/dL (ref 6–20)
CALCIUM: 9.6 mg/dL (ref 8.9–10.3)
CO2: 27 mmol/L (ref 22–32)
Chloride: 105 mmol/L (ref 101–111)
Creatinine, Ser: 0.62 mg/dL (ref 0.44–1.00)
GLUCOSE: 105 mg/dL — AB (ref 65–99)
Potassium: 3.5 mmol/L (ref 3.5–5.1)
Sodium: 138 mmol/L (ref 135–145)

## 2015-11-20 LAB — CBC
HCT: 43.7 % (ref 35.0–47.0)
HEMOGLOBIN: 14.8 g/dL (ref 12.0–16.0)
MCH: 29 pg (ref 26.0–34.0)
MCHC: 33.9 g/dL (ref 32.0–36.0)
MCV: 85.4 fL (ref 80.0–100.0)
Platelets: 223 10*3/uL (ref 150–440)
RBC: 5.12 MIL/uL (ref 3.80–5.20)
RDW: 12.7 % (ref 11.5–14.5)
WBC: 10.4 10*3/uL (ref 3.6–11.0)

## 2015-11-20 LAB — TROPONIN I: Troponin I: <0.03 ng/mL (ref <0.031)

## 2015-11-20 NOTE — ED Notes (Signed)
Pt presents to ED with c/o intermittent dizziness, onset x3-4 weeks. Pt states onset of dizziness today at work and worsen when she was driving home. Pt describes dizziness feeling like she was going to pass out. Pt denies dizziness now.

## 2015-11-21 ENCOUNTER — Emergency Department: Payer: BLUE CROSS/BLUE SHIELD

## 2015-11-21 LAB — TSH: TSH: 2.683 u[IU]/mL (ref 0.350–4.500)

## 2015-11-21 NOTE — ED Provider Notes (Signed)
[facility] Emergency Department Provider Note  ____________________________________________  Time seen: 12:10 AM  I have reviewed the triage vital signs and the nursing notes.   HISTORY  Chief Complaint Dizziness    HPI Elizabeth Oliver is a 55 y.o. female presents with intermittent episodes of "feeling like house, passed out" 4 weeks. . Patient denies any chest pain or shortness of breath, dizziness, headache or any accompanying symptoms before the events. Patient states that she had an episode today while driving home where she felt as though she was given a pass out but she did not lose consciousness.     Past Medical History  Diagnosis Date  . Fatty liver   . Depression   . Anxiety   . GERD (gastroesophageal reflux disease)   . Headache   . Arthritis   . Multilevel degenerative disc disease     There are no active problems to display for this patient.   Past Surgical History  Procedure Laterality Date  . Tonsillectomy    . Laparoscopic ovarian cystectomy    . Hysteroscopy w/d&c N/A 08/10/2015    Procedure: DILATATION AND CURETTAGE /HYSTEROSCOPY;  Surgeon: Benjaman Kindler, MD;  Location: [facility] ORS;  Service: Gynecology;  Laterality: N/A;    Current Outpatient Rx  Name  Route  Sig  Dispense  Refill  . docusate sodium (COLACE) 100 MG capsule   Oral   Take 1 capsule (100 mg total) by mouth 2 (two) times daily as needed for mild constipation.   10 capsule   0   . ibuprofen (ADVIL,MOTRIN) 800 MG tablet   Oral   Take 1 tablet (800 mg total) by mouth every 8 (eight) hours as needed for moderate pain.   30 tablet   1   . ondansetron (ZOFRAN ODT) 4 MG disintegrating tablet   Oral   Take 1 tablet (4 mg total) by mouth every 6 (six) hours as needed for nausea.   20 tablet   0   . oxyCODONE-acetaminophen (PERCOCET/ROXICET) 5-325 MG tablet   Oral   Take 1-2 tablets by mouth every 6 (six) hours as needed.   6 tablet   0      Allergies Latex  History reviewed. No pertinent family history.  Social History Social History  Substance Use Topics  . Smoking status: Never Smoker   . Smokeless tobacco: Never Used  . Alcohol Use: No    Review of Systems  Constitutional: Negative for fever. Eyes: Negative for visual changes. ENT: Negative for sore throat. Cardiovascular: Negative for chest pain. Respiratory: Negative for shortness of breath. Gastrointestinal: Negative for abdominal pain, vomiting and diarrhea. Genitourinary: Negative for dysuria. Musculoskeletal: Negative for back pain. Skin: Negative for rash. Neurological: Negative for headaches, focal weakness or numbness. Positive for near syncope  10-point ROS otherwise negative.  ____________________________________________   PHYSICAL EXAM:  VITAL SIGNS: ED Triage Vitals  Enc Vitals Group     BP 11/20/15 2006 166/94 mmHg     Pulse Rate 11/20/15 2006 79     Resp 11/20/15 2006 18     Temp 11/20/15 2006 98.1 F (36.7 C)     Temp Source 11/20/15 2006 Oral     SpO2 11/20/15 2006 98 %     Weight --      Height --      Head Cir --      Peak Flow --      Pain Score 11/20/15 2005 0     Pain Loc --  Pain Edu? --      Excl. in [place]? --      Constitutional: Alert and oriented. Well appearing and in no distress. Eyes: Conjunctivae are normal. PERRL. Normal extraocular movements. ENT   Head: Normocephalic and atraumatic.   Nose: No congestion/rhinnorhea.   Mouth/Throat: Mucous membranes are moist.   Neck: No stridor. Hematological/Lymphatic/Immunilogical: No cervical lymphadenopathy. Cardiovascular: Normal rate, regular rhythm. Normal and symmetric distal pulses are present in all extremities. No murmurs, rubs, or gallops. Respiratory: Normal respiratory effort without tachypnea nor retractions. Breath sounds are clear and equal bilaterally. No wheezes/rales/rhonchi. Gastrointestinal: Soft and nontender. No distention.  There is no CVA tenderness. Genitourinary: deferred Musculoskeletal: Nontender with normal range of motion in all extremities. No joint effusions.  No lower extremity tenderness nor edema. Neurologic:  Normal speech and language. No gross focal neurologic deficits are appreciated. Speech is normal.  Skin:  Skin is warm, dry and intact. No rash noted. Psychiatric: Mood and affect are normal. Speech and behavior are normal. Patient exhibits appropriate insight and judgment.  ____________________________________________    LABS (pertinent positives/negatives)  Labs Reviewed  BASIC METABOLIC PANEL - Abnormal; Notable for the following:    Glucose, Bld 105 (*)    All other components within normal limits  URINALYSIS COMPLETEWITH MICROSCOPIC ([facility] ONLY) - Abnormal; Notable for the following:    Color, Urine STRAW (*)    APPearance CLEAR (*)    Squamous Epithelial / LPF 0-5 (*)    All other components within normal limits  CBC  TROPONIN I  TSH     ____________________________________________   EKG  ED ECG REPORT I, Moiz Ryant,  N, the attending physician, personally viewed and interpreted this ECG.   Date: 11/21/2015  EKG Time: 8:31 PM  Rate: 77  Rhythm: normal sinus rhythm  Axis: Normal  Intervals:Normal   ST&T Change: None   RADIOLOGY     CT Head Wo Contrast (Final result) Result time: 11/21/15 02:00:33   Final result by Rad Results In Interface (11/21/15 02:00:33)   Narrative:   CLINICAL DATA: 55 year old female with dizziness for the past 2 3 weeks.  EXAM: CT HEAD WITHOUT CONTRAST  TECHNIQUE: Contiguous axial images were obtained from the base of the skull through the vertex without intravenous contrast.  COMPARISON: None.  FINDINGS: The ventricles and sulci are appropriate in size for the patient's age. Minimal periventricular and deep white matter hypodensities represent chronic microvascular ischemic changes. There is no intracranial  hemorrhage. No mass effect or midline shift identified.  The visualized paranasal sinuses and mastoid air cells are well aerated. The calvarium is intact.  IMPRESSION: No acute intracranial pathology.   Electronically Signed By: Anner Crete M.D. On: 11/21/2015 02:00      INITIAL IMPRESSION / ASSESSMENT AND PLAN / ED COURSE  Pertinent labs & imaging results that were available during my care of the patient were reviewed by me and considered in my medical decision making (see chart for details).   No no clear etiology for the patient's near syncopal episodes that she's been having intermittently for the past month. CT scan of the head negative EKG revealed no gross abnormalities lab data unremarkable. We will refer the patient to primary care provider for further evaluation and management on the outpatient setting.  ____________________________________________   FINAL CLINICAL IMPRESSION(S) / ED DIAGNOSES  Final diagnoses:  Near syncope      Gregor Hams, MD 11/21/15 [phone]

## 2015-11-21 NOTE — ED Notes (Signed)
Pt made aware of delay due to multiple emergent situations MD is tied up with. Pt made aware and verbalized understanding at this time, pt with no further needs. Pt NAD at this time

## 2015-11-21 NOTE — ED Notes (Signed)
SEE DOWNTIME PAPERWORK PLACED IN PT'S CHART.

## 2015-11-21 NOTE — Discharge Instructions (Signed)

## 2015-12-12 ENCOUNTER — Ambulatory Visit (INDEPENDENT_AMBULATORY_CARE_PROVIDER_SITE_OTHER): Payer: BLUE CROSS/BLUE SHIELD | Admitting: Neurology

## 2015-12-12 ENCOUNTER — Encounter: Payer: Self-pay | Admitting: Neurology

## 2015-12-12 VITALS — BP 172/96 | HR 96 | Ht 67.0 in | Wt 220.0 lb

## 2015-12-12 DIAGNOSIS — R410 Disorientation, unspecified: Secondary | ICD-10-CM

## 2015-12-12 DIAGNOSIS — R7309 Other abnormal glucose: Secondary | ICD-10-CM

## 2015-12-12 NOTE — Progress Notes (Signed)
PATIENT: Elizabeth Oliver DOB: Oct 10, 1961  Chief Complaint  Patient presents with  . Pre-syncope    Says she has events that make her feel as if she is going to faint but she has never loss consciousness.  Says the sympotms occur at various times and are not associated with positional changes.  Reports having a history of two head injuries as a child.     HISTORICAL  Elizabeth Oliver is a 55 years old right-handed female, alone at today's clinical visit, seen in refer by her primary care physician Dr. Kirk Ruths in March first 2017 for evaluation of near faint spells.  She had a history of depression anxiety, fatty liver disease, she reported recurrent episode of feeling faint since end of January 2017, last episode was December 01 2015.  In December 01 2015, after lunch, she went to [place], after walking around for about 20 minutes, she noticed she is struggling to keep awake, feeling tired, she was able to drive back home, she had no loss of consciousness, continued rest of the afternoon, but has vague memory of few hours  Previous similar episode often happened when she felt hungry, she felt she is going to faint, mild confused, lasting few minutes, to few hours,  In few occasions, she was able to check her glucose level that was in 100s, she was told to have mild elevated glucose level in the past few years, but not taking any medication treatment.  I reviewed CAT scan of the brain without contrast February 2017, no significant abnormality, Laboratory evaluation in February 2017, mild elevated glucose 105, otherwise normal BMP, CBC, TSH  She reported 2 incidents of childhood head injury, first incident was when she was at first grade, icy block fell on her head from third story building, she had no loss of consciousness. The second episode was when she was at sixth-grade, she was hit by a baseball, with skull abrasion, no loss of consciousness,  She also brought previous  MRI report in 2009 from outside hospital, MRI was taken for her complains of 4 extremity paresthesia. MRI of the brain without contrast showed no significant abnormality, MRI of the cervical spine showed mild multilevel degenerative changes, no evidence of significant foraminal canal stenosis.  REVIEW OF SYSTEMS: Full 14 system review of systems performed and notable only for fatigue, murmur, trouble swallowing, snoring, feeling hot, flushing, joint pain, allergy, weakness, difficulty swallowing, dizziness, snoring, depression, anxiety, decreased energy.  ALLERGIES: Allergies  Allergen Reactions  . Latex Itching    "redness"    HOME MEDICATIONS: No current outpatient prescriptions on file.   No current facility-administered medications for this visit.    PAST MEDICAL HISTORY: Past Medical History  Diagnosis Date  . Fatty liver   . Depression   . Anxiety   . GERD (gastroesophageal reflux disease)   . Headache   . Arthritis   . Multilevel degenerative disc disease   . Pre-syncope   . History of head injury     childhood    PAST SURGICAL HISTORY: Past Surgical History  Procedure Laterality Date  . Tonsillectomy    . Laparoscopic ovarian cystectomy    . Hysteroscopy w/d&c N/A 08/10/2015    Procedure: DILATATION AND CURETTAGE /HYSTEROSCOPY;  Surgeon: Benjaman Kindler, MD;  Location: [facility] ORS;  Service: Gynecology;  Laterality: N/A;    FAMILY HISTORY: Family History  Problem Relation Age of Onset  . Alzheimer's disease Mother   . Colon cancer Father   .  Rectal cancer Father   . Heart disease Maternal Grandmother   . Heart disease Paternal Grandmother   . Heart disease Paternal Grandfather   . Heart disease Father   . Diabetes Father   . Diabetes Paternal Grandfather   . Diabetes Maternal Grandmother   . Hypertension Father   . Hypertension Paternal Grandfather   . Stomach cancer Maternal Grandmother   . Alcoholism Maternal Grandfather     SOCIAL HISTORY:  Social  History   Social History  . Marital Status: Single    Spouse Name: N/A  . Number of Children: 0  . Years of Education: Masters   Occupational History  . Behavioral Health    Social History Main Topics  . Smoking status: Never Smoker   . Smokeless tobacco: Never Used  . Alcohol Use: 0.0 oz/week    0 Standard drinks or equivalent per week     Comment: Social use  . Drug Use: No  . Sexual Activity: Not on file   Other Topics Concern  . Not on file   Social History Narrative   Lives at home alone.   Right-handed.   4 cups caffeine daily.     PHYSICAL EXAM   Filed Vitals:   12/12/15 0731  BP: 172/96  Pulse: 96  Height: 5\' 7"  (1.702 m)  Weight: 220 lb (99.791 kg)    Not recorded      Body mass index is 34.45 kg/(m^2).  PHYSICAL EXAMNIATION:  Gen: NAD, conversant, well nourised, obese, well groomed                     Cardiovascular: Regular rate rhythm, no peripheral edema, warm, nontender. Eyes: Conjunctivae clear without exudates or hemorrhage Neck: Supple, no carotid bruise. Pulmonary: Clear to auscultation bilaterally   NEUROLOGICAL EXAM:  MENTAL STATUS: Speech:    Speech is normal; fluent and spontaneous with normal comprehension.  Cognition:     Orientation to time, place and person     Normal recent and remote memory     Normal Attention span and concentration     Normal Language, naming, repeating,spontaneous speech     Fund of knowledge   CRANIAL NERVES: CN II: Visual fields are full to confrontation. Fundoscopic exam is normal with sharp discs and no vascular changes. Pupils are round equal and briskly reactive to light. CN III, IV, VI: extraocular movement are normal. No ptosis. CN V: Facial sensation is intact to pinprick in all 3 divisions bilaterally. Corneal responses are intact.  CN VII: Face is symmetric with normal eye closure and smile. CN VIII: Hearing is normal to rubbing fingers CN IX, X: Palate elevates symmetrically. Phonation  is normal. CN XI: Head turning and shoulder shrug are intact CN XII: Tongue is midline with normal movements and no atrophy.  MOTOR: There is no pronator drift of out-stretched arms. Muscle bulk and tone are normal. Muscle strength is normal.  REFLEXES: Reflexes are 2+ and symmetric at the biceps, triceps, knees, and ankles. Plantar responses are flexor.  SENSORY: Intact to light touch, pinprick, position sense, and vibration sense are intact in fingers and toes.  COORDINATION: Rapid alternating movements and fine finger movements are intact. There is no dysmetria on finger-to-nose and heel-knee-shin.    GAIT/STANCE: Posture is normal. Gait is steady with normal steps, base, arm swing, and turning. Heel and toe walking are normal. Tandem gait is normal.  Romberg is absent.   DIAGNOSTIC DATA (LABS, IMAGING, TESTING) - I reviewed patient  records, labs, notes, testing and imaging myself where available.   ASSESSMENT AND PLAN  DECIE BABAYEV is a 55 y.o. female   Intermittent fainting spell  Differentiation diagnosis includes fluctuation of glucose level, medicine side effect, lack of sleep, mood disorder  History does not suggestive of seizure  I have advised patient continue observing her symptoms, call clinic for recurrent symptoms,  Excessive Daytime sleepiness and fatigue  She does has mild overweight, narrow oropharyngeal space, ESS score is 6,  She is at risk for developing obstructive sleep apnea, I have advised her exercise regularly, weight loss, sleep on her side   Marcial Pacas, M.D. Ph.D.  [facility] [address] [place], [place] [address] Ph: [phone] Fax: [phone]  CC: Kirk Ruths, MD

## 2015-12-13 LAB — HEMOGLOBIN A1C
Est. average glucose Bld gHb Est-mCnc: 123 mg/dL
Hgb A1c MFr Bld: 5.9 % — ABNORMAL HIGH (ref 4.8–5.6)

## 2015-12-13 LAB — VITAMIN B12: Vitamin B-12: 685 pg/mL (ref 211–946)

## 2016-03-20 ENCOUNTER — Other Ambulatory Visit: Payer: Self-pay | Admitting: Internal Medicine

## 2016-03-20 DIAGNOSIS — Z1231 Encounter for screening mammogram for malignant neoplasm of breast: Secondary | ICD-10-CM

## 2016-04-03 ENCOUNTER — Other Ambulatory Visit: Payer: Self-pay | Admitting: Internal Medicine

## 2016-04-03 ENCOUNTER — Ambulatory Visit
Admission: RE | Admit: 2016-04-03 | Discharge: 2016-04-03 | Disposition: A | Discharge status: Home / Self Care | Payer: BLUE CROSS/BLUE SHIELD | Source: Ambulatory Visit | Attending: Internal Medicine | Admitting: Internal Medicine

## 2016-04-03 DIAGNOSIS — Z1231 Encounter for screening mammogram for malignant neoplasm of breast: Secondary | ICD-10-CM | POA: Insufficient documentation

## 2016-12-18 ENCOUNTER — Ambulatory Visit (HOSPITAL_COMMUNITY)
Admission: EM | Admit: 2016-12-18 | Discharge: 2016-12-18 | Disposition: A | Discharge status: Home / Self Care | Payer: BLUE CROSS/BLUE SHIELD

## 2016-12-18 ENCOUNTER — Encounter (HOSPITAL_COMMUNITY): Payer: Self-pay | Admitting: Emergency Medicine

## 2016-12-18 DIAGNOSIS — Z206 Contact with and (suspected) exposure to human immunodeficiency virus [HIV]: Secondary | ICD-10-CM

## 2016-12-18 DIAGNOSIS — Z205 Contact with and (suspected) exposure to viral hepatitis: Secondary | ICD-10-CM

## 2016-12-18 NOTE — ED Triage Notes (Signed)
Pt is here for lab work (HIV/HEP C)  Reports she was exposed to blood last week and pt was positive for HIV and Hep B/C  Was sent here by employer for lab work  A&O x4... NAD

## 2016-12-18 NOTE — ED Provider Notes (Signed)
CSN: [id number]     Arrival date & time 12/18/16  1814 History   None    Chief Complaint  Patient presents with  . Body Fluid Exposure   (Consider location/radiation/quality/duration/timing/severity/associated sxs/prior Treatment) Patient states she was exposed to blood from a patient at her work who has hx of hepatitis, unknown type, and HIV.  She states she did have small cuts on her right hand and shook the patients hand and later she found out he had blood on his hands.  This exposure happened a week ago.  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   The history is provided by the patient.  Body Fluid Exposure  Type of exposure:  Body fluid Exposure substance: blood   Exposure location:  Hand Hand exposure location:  R hand Time since exposure:  1 week Context:  Occupational (non-healthcare) Patient immunocompromised: no   Known source: yes   Source HIV status:  Positive Source hepatitis B status:  Unknown Source hepatitis C status:  Unknown STD concern: no     Past Medical History:  Diagnosis Date  . Anxiety   . Arthritis   . Depression   . Fatty liver   . GERD (gastroesophageal reflux disease)   . Headache   . History of head injury    childhood  . Multilevel degenerative disc disease   . Pre-syncope    Past Surgical History:  Procedure Laterality Date  . BREAST BIOPSY     u/s needle bx ?side-neg  . HYSTEROSCOPY W/D&C N/A 08/10/2015   Procedure: DILATATION AND CURETTAGE /HYSTEROSCOPY;  Surgeon: Benjaman Kindler, MD;  Location: [facility] ORS;  Service: Gynecology;  Laterality: N/A;  . LAPAROSCOPIC OVARIAN CYSTECTOMY    . TONSILLECTOMY     Family History  Problem Relation Age of Onset  . Alzheimer's disease Mother   . Colon cancer Father   . Rectal cancer Father   . Heart disease Father   . Diabetes Father   . Hypertension Father   . Heart disease Maternal Grandmother   . Diabetes Maternal Grandmother   . Stomach cancer Maternal Grandmother   . Heart disease  Paternal Grandmother   . Heart disease Paternal Grandfather   . Diabetes Paternal Grandfather   . Hypertension Paternal Grandfather   . Alcoholism Maternal Grandfather   . Breast cancer Neg Hx    Social History  Substance Use Topics  . Smoking status: Never Smoker  . Smokeless tobacco: Never Used  . Alcohol use 0.0 oz/week     Comment: Social use   OB History    No data available     Review of Systems  Constitutional: Negative.   HENT: Negative.   Eyes: Negative.   Respiratory: Negative.   Cardiovascular: Negative.   Gastrointestinal: Negative.   Endocrine: Negative.   Genitourinary: Negative.   Musculoskeletal: Negative.   Allergic/Immunologic: Negative.   Neurological: Negative.   Hematological: Negative.   Psychiatric/Behavioral: Negative.     Allergies  Latex  Home Medications   Prior to Admission medications   Medication Sig Start Date End Date Taking? Authorizing Provider  amLODipine (NORVASC) 5 MG tablet Take 5 mg by mouth daily.   Yes Historical Provider, MD   Meds Ordered and Administered this Visit  Medications - No data to display  BP 157/100 (BP Location: Right Arm)   Pulse 75   Temp 98.3 F (36.8 C) (Oral)   Resp 14   SpO2 99%  No data found.   Physical Exam  Constitutional: She  appears well-developed and well-nourished.  HENT:  Head: Normocephalic and atraumatic.  Right Ear: External ear normal.  Left Ear: External ear normal.  Mouth/Throat: Oropharynx is clear and moist.  Eyes: Conjunctivae and EOM are normal. Pupils are equal, round, and reactive to light.  Neck: Normal range of motion. Neck supple.  Cardiovascular: Normal rate, regular rhythm and normal heart sounds.   Pulmonary/Chest: Effort normal and breath sounds normal.  Nursing note and vitals reviewed.   Urgent Care Course     Procedures (including critical care time)  Labs Review Labs Reviewed - No data to display  Imaging Review No results found.   Visual  Acuity Review  Right Eye Distance:   Left Eye Distance:   Bilateral Distance:    Right Eye Near:   Left Eye Near:    Bilateral Near:         MDM   1. HIV exposure   2. Exposure to hepatitis    Referral to Occupational Health Call office tomorrow      Lysbeth Penner, [place] 12/18/16 Drema Halon

## 2017-03-12 ENCOUNTER — Other Ambulatory Visit: Payer: Self-pay | Admitting: Internal Medicine

## 2017-03-12 DIAGNOSIS — Z1231 Encounter for screening mammogram for malignant neoplasm of breast: Secondary | ICD-10-CM

## 2017-04-06 ENCOUNTER — Ambulatory Visit
Admission: RE | Admit: 2017-04-06 | Discharge: 2017-04-06 | Disposition: A | Discharge status: Home / Self Care | Payer: BLUE CROSS/BLUE SHIELD | Source: Ambulatory Visit | Attending: Internal Medicine | Admitting: Internal Medicine

## 2017-04-06 DIAGNOSIS — Z1231 Encounter for screening mammogram for malignant neoplasm of breast: Secondary | ICD-10-CM | POA: Diagnosis not present

## 2017-08-26 ENCOUNTER — Emergency Department
Admission: EM | Admit: 2017-08-26 | Discharge: 2017-08-26 | Disposition: A | Discharge status: Home / Self Care | Payer: BLUE CROSS/BLUE SHIELD | Attending: Emergency Medicine | Admitting: Emergency Medicine

## 2017-08-26 ENCOUNTER — Other Ambulatory Visit: Payer: Self-pay

## 2017-08-26 DIAGNOSIS — R55 Syncope and collapse: Secondary | ICD-10-CM | POA: Insufficient documentation

## 2017-08-26 DIAGNOSIS — Z79899 Other long term (current) drug therapy: Secondary | ICD-10-CM | POA: Insufficient documentation

## 2017-08-26 DIAGNOSIS — R42 Dizziness and giddiness: Secondary | ICD-10-CM | POA: Diagnosis present

## 2017-08-26 LAB — URINALYSIS, COMPLETE (UACMP) WITH MICROSCOPIC
Bacteria, UA: NONE SEEN
Bilirubin Urine: NEGATIVE
Glucose, UA: NEGATIVE mg/dL
HGB URINE DIPSTICK: NEGATIVE
KETONES UR: NEGATIVE mg/dL
LEUKOCYTES UA: NEGATIVE
Nitrite: NEGATIVE
PROTEIN: NEGATIVE mg/dL
SQUAMOUS EPITHELIAL / LPF: NONE SEEN
Specific Gravity, Urine: 1.003 — ABNORMAL LOW (ref 1.005–1.030)
pH: 7 (ref 5.0–8.0)

## 2017-08-26 LAB — CBC
HCT: 45 % (ref 35.0–47.0)
HEMOGLOBIN: 15 g/dL (ref 12.0–16.0)
MCH: 28.7 pg (ref 26.0–34.0)
MCHC: 33.3 g/dL (ref 32.0–36.0)
MCV: 86.4 fL (ref 80.0–100.0)
Platelets: 247 10*3/uL (ref 150–440)
RBC: 5.21 MIL/uL — AB (ref 3.80–5.20)
RDW: 12.9 % (ref 11.5–14.5)
WBC: 10.4 10*3/uL (ref 3.6–11.0)

## 2017-08-26 LAB — BASIC METABOLIC PANEL
ANION GAP: 12 (ref 5–15)
BUN: 15 mg/dL (ref 6–20)
CHLORIDE: 104 mmol/L (ref 101–111)
CO2: 23 mmol/L (ref 22–32)
Calcium: 9.5 mg/dL (ref 8.9–10.3)
Creatinine, Ser: 0.44 mg/dL (ref 0.44–1.00)
GFR calc non Af Amer: >60 mL/min (ref >60)
Glucose, Bld: 100 mg/dL — ABNORMAL HIGH (ref 65–99)
Potassium: 3.7 mmol/L (ref 3.5–5.1)
Sodium: 139 mmol/L (ref 135–145)

## 2017-08-26 MED ORDER — HYDROXYZINE HCL 25 MG PO TABS
25.0000 mg | ORAL_TABLET | Freq: Three times a day (TID) | ORAL | 0 refills | Status: AC | PRN
Start: 2017-08-26 — End: ?

## 2017-08-26 NOTE — ED Provider Notes (Signed)
[facility] Department Provider Note  Time seen: 3:04 PM  I have reviewed the triage vital signs and the nursing notes.   HISTORY  Chief Complaint Dizziness    HPI Elizabeth Oliver is a 56 y.o. female with a past medical history of anxiety, arthritis, gastric reflux, presents to the emergency department with lightheadedness.  According to the patient for the past 1-2 weeks she has been having intermittent episodes of feeling lightheaded.  Patient describes these episodes as feeling like she is almost going to pass out, denies any dizziness.  Denies any headache, focal weakness or numbness.  Patient states she has had similar episodes in the past, was seen by cardiology and did a 30-day Holter monitor was diagnosed with tachycardia and placed on metoprolol 25 mg daily.  Patient states she has been taking this medication but continues to have similar episodes.  Patient also states a history of anxiety and she is not sure if this is anxiety related.  Denies any recent illnesses, chest pain, abdominal pain, vomiting or diarrhea.  Past Medical History:  Diagnosis Date  . Anxiety   . Arthritis   . Depression   . Fatty liver   . GERD (gastroesophageal reflux disease)   . Headache   . History of head injury    childhood  . Multilevel degenerative disc disease   . Pre-syncope     Patient Active Problem List   Diagnosis Date Noted  . Confusion 12/12/2015  . Abnormal glucose 12/12/2015    Past Surgical History:  Procedure Laterality Date  . BREAST BIOPSY     u/s needle bx ?side-neg  . LAPAROSCOPIC OVARIAN CYSTECTOMY    . TONSILLECTOMY      Prior to Admission medications   Medication Sig Start Date End Date Taking? Authorizing Provider  amLODipine (NORVASC) 5 MG tablet Take 5 mg by mouth daily.    [provider]    Allergies  Allergen Reactions  . Latex Itching    "redness"    Family History  Problem Relation Age of Onset  .  Alzheimer's disease Mother   . Colon cancer Father   . Rectal cancer Father   . Heart disease Father   . Diabetes Father   . Hypertension Father   . Heart disease Maternal Grandmother   . Diabetes Maternal Grandmother   . Stomach cancer Maternal Grandmother   . Heart disease Paternal Grandmother   . Heart disease Paternal Grandfather   . Diabetes Paternal Grandfather   . Hypertension Paternal Grandfather   . Alcoholism Maternal Grandfather   . Breast cancer Neg Hx     Social History Social History   Tobacco Use  . Smoking status: Never Smoker  . Smokeless tobacco: Never Used  Substance Use Topics  . Alcohol use: Yes    Alcohol/week: 0.0 oz    Comment: Social use  . Drug use: No    Review of Systems Constitutional: Negative for fever. Cardiovascular: Negative for chest pain. Respiratory: Negative for shortness of breath. Gastrointestinal: Negative for abdominal pain, vomiting and diarrhea. Genitourinary: Negative for dysuria Neurological: Negative for headache All other ROS negative  ____________________________________________   PHYSICAL EXAM:  VITAL SIGNS: ED Triage Vitals  Enc Vitals Group     BP 08/26/17 1330 (!) 160/95     Pulse Rate 08/26/17 1330 79     Resp 08/26/17 1330 17     Temp 08/26/17 1330 99.3 F (37.4 C)     Temp Source 08/26/17  1330 Oral     SpO2 08/26/17 1330 99 %     Weight 08/26/17 1334 218 lb (98.9 kg)     Height 08/26/17 1334 5\' 7"  (1.702 m)     Head Circumference --      Peak Flow --      Pain Score --      Pain Loc --      Pain Edu? --      Excl. in [place]? --    Constitutional: Alert and oriented. Well appearing and in no distress. Eyes: Normal exam ENT   Head: Normocephalic and atraumatic.  Normal tympanic membranes.   Mouth/Throat: Mucous membranes are moist. Cardiovascular: Normal rate, regular rhythm. No murmur Respiratory: Normal respiratory effort without tachypnea nor retractions. Breath sounds are clear   Gastrointestinal: Soft and nontender. No distention.   Musculoskeletal: Nontender with normal range of motion in all extremities. Neurologic:  Normal speech and language. No gross focal neurologic deficits  Skin:  Skin is warm, dry and intact.  Psychiatric: Mood and affect are normal.   ____________________________________________    EKG  EKG reviewed and interpreted by myself shows normal sinus rhythm 85 bpm, narrow QRS, left axis deviation, normal intervals, nonspecific but no concerning ST changes.  ____________________________________________  INITIAL IMPRESSION / ASSESSMENT AND PLAN / ED COURSE  Pertinent labs & imaging results that were available during my care of the patient were reviewed by me and considered in my medical decision making (see chart for details).  Patient presents to the emergency department for intermittent lightheadedness.  Differential would include electrolyte abnormality, dehydration, orthostatic hypotension, near syncope, anxiety.  Overall the patient appears extremely well, no neurological deficits to suggest CVA.  Denies any spinning sensation does not appear to be vertigo related.  Patient's labs are largely within normal limits.  Normal physical examination.  I suspect the patient's symptoms could possibly be anxiety related.  I suggested a short course of hydroxyzine as a trial.  Patient agreeable to this plan of care and will follow up with her doctor.  I reviewed the patient's records including a February 2017 ED visit for very similar symptoms.  ____________________________________________   FINAL CLINICAL IMPRESSION(S) / ED DIAGNOSES  Near syncope    Harvest Dark, MD 08/26/17 [phone]

## 2017-08-26 NOTE — ED Notes (Signed)
MD at bedside. 

## 2017-08-26 NOTE — ED Triage Notes (Signed)
Pt sent over from [facility] with c/o dizziness over the past 2 weeks that has worsened today. States she is feeling flushed and just not well

## 2018-10-08 ENCOUNTER — Other Ambulatory Visit: Payer: Self-pay

## 2018-10-08 ENCOUNTER — Encounter (HOSPITAL_BASED_OUTPATIENT_CLINIC_OR_DEPARTMENT_OTHER): Payer: Self-pay | Admitting: *Deleted

## 2018-10-08 ENCOUNTER — Emergency Department (HOSPITAL_BASED_OUTPATIENT_CLINIC_OR_DEPARTMENT_OTHER)
Admission: EM | Admit: 2018-10-08 | Discharge: 2018-10-08 | Disposition: A | Discharge status: Home / Self Care | Payer: BLUE CROSS/BLUE SHIELD | Attending: Emergency Medicine | Admitting: Emergency Medicine

## 2018-10-08 DIAGNOSIS — Z79899 Other long term (current) drug therapy: Secondary | ICD-10-CM | POA: Insufficient documentation

## 2018-10-08 DIAGNOSIS — R42 Dizziness and giddiness: Secondary | ICD-10-CM

## 2018-10-08 HISTORY — DX: Sleep apnea, unspecified: G47.30

## 2018-10-08 LAB — CBC WITH DIFFERENTIAL/PLATELET
Abs Immature Granulocytes: 0.02 10*3/uL (ref 0.00–0.07)
BASOS ABS: 0.1 10*3/uL (ref 0.0–0.1)
Basophils Relative: 1 %
EOS PCT: 2 %
Eosinophils Absolute: 0.2 10*3/uL (ref 0.0–0.5)
HCT: 45.9 % (ref 36.0–46.0)
HEMOGLOBIN: 14.9 g/dL (ref 12.0–15.0)
Immature Granulocytes: 0 %
LYMPHS PCT: 25 %
Lymphs Abs: 2.4 10*3/uL (ref 0.7–4.0)
MCH: 28 pg (ref 26.0–34.0)
MCHC: 32.5 g/dL (ref 30.0–36.0)
MCV: 86.1 fL (ref 80.0–100.0)
Monocytes Absolute: 0.8 10*3/uL (ref 0.1–1.0)
Monocytes Relative: 8 %
NEUTROS PCT: 64 %
NRBC: 0 % (ref 0.0–0.2)
Neutro Abs: 6.3 10*3/uL (ref 1.7–7.7)
Platelets: 242 10*3/uL (ref 150–400)
RBC: 5.33 MIL/uL — ABNORMAL HIGH (ref 3.87–5.11)
RDW: 12 % (ref 11.5–15.5)
WBC: 9.8 10*3/uL (ref 4.0–10.5)

## 2018-10-08 LAB — BASIC METABOLIC PANEL
ANION GAP: 8 (ref 5–15)
BUN: 14 mg/dL (ref 6–20)
CHLORIDE: 103 mmol/L (ref 98–111)
CO2: 26 mmol/L (ref 22–32)
CREATININE: 0.57 mg/dL (ref 0.44–1.00)
Calcium: 9.5 mg/dL (ref 8.9–10.3)
GFR calc non Af Amer: >60 mL/min (ref >60)
Glucose, Bld: 122 mg/dL — ABNORMAL HIGH (ref 70–99)
POTASSIUM: 3.3 mmol/L — AB (ref 3.5–5.1)
SODIUM: 137 mmol/L (ref 135–145)

## 2018-10-08 MED ORDER — LORAZEPAM 1 MG PO TABS
1.0000 mg | ORAL_TABLET | Freq: Once | ORAL | Status: AC
  Administered 2018-10-08: 1 mg via ORAL
  Filled 2018-10-08: qty 1

## 2018-10-08 MED ORDER — POTASSIUM CHLORIDE CRYS ER 20 MEQ PO TBCR
20.0000 meq | EXTENDED_RELEASE_TABLET | Freq: Once | ORAL | Status: AC
  Administered 2018-10-08: 20 meq via ORAL
  Filled 2018-10-08: qty 1

## 2018-10-08 MED ORDER — MECLIZINE HCL 25 MG PO TABS
25.0000 mg | ORAL_TABLET | Freq: Once | ORAL | Status: AC
  Administered 2018-10-08: 25 mg via ORAL
  Filled 2018-10-08: qty 1

## 2018-10-08 MED ORDER — MECLIZINE HCL 12.5 MG PO TABS: 12.5000 mg | ORAL_TABLET | Freq: Three times a day (TID) | ORAL | 0 refills | Status: AC | PRN

## 2018-10-08 MED ORDER — ONDANSETRON 4 MG PO TBDP
4.0000 mg | ORAL_TABLET | Freq: Once | ORAL | Status: AC
  Administered 2018-10-08: 4 mg via ORAL
  Filled 2018-10-08: qty 1

## 2018-10-08 NOTE — Discharge Instructions (Addendum)
Please do not drive while taking meclizine as it can make you drowsy. Please see attached handout. Follow up with your pcp or neurology in the next 1 week. Please call [facility] as well to establish care. They can perform maneuvers to assist you with your vertigo symptoms. If you develop worsening or new concerning symptoms you can return to the emergency department for re-evaluation.   Other reasons to return include..  Get help right away if: Your double vision, loss of vision or changes in your vision. Your dizziness becomes constant You have difficulty talking or moving You have weakness of an arm or leg  You passout You develop severe headache or neck pain You feel weak or have trouble using your hands, arms, or legs. You have changes in your hearing. You have changes in your seeing (vision). You get a stiff neck. Bright light starts to bother you. Or any other concerning symptoms you are welcome to present to the emergency department for further evaluation

## 2018-10-08 NOTE — ED Triage Notes (Signed)
Pt has been feeling dizzy since she woke up this am.  Pt states that it feels like "the room is spinning".  Pt states that the symptoms of the room spinning was increased with any change in position. No CP with this.No focal weakness with this.

## 2018-10-08 NOTE — ED Provider Notes (Signed)
[facility] EMERGENCY DEPARTMENT Provider Note   CSN: [id number] Arrival date & time: 10/08/18  1446     History   Chief Complaint Chief Complaint  Patient presents with  . Dizziness    HPI Elizabeth Oliver is a 57 y.o. female with medical history as below who presents emergency department today for dizziness.  Patient reports that she woke this morning when sitting up in her bed she had the sudden onset of dizziness that she describes as the room spinning.  Patient reports that her vertigo lasted for approximately 2 to 3 minutes before resolving.  She went to use the restroom and when attempting to lie back down her dizziness began again with the same symptoms also included nausea.  Patient reports anytime that she is lying down or bent over during the day her symptoms have returned.  Her symptoms always lasts less than 5-10 minutes and are relieved with rest.  She reports that she has had one episode of emesis as the vertigo was severe enough.  She is currently asymptomatic.  Patient denies any associated head trauma or loss of consciousness.  She denies any headache.  No recent illnesses.  Patient denies any visual changes, diplopia, facial droop, difficulty with speech, numbness/tingling of the face or extremities, weakness of the extremities, difficulty with gait, nasal congestion, rhinorrhea, sore throat, ear fullness.  Patient denies history of similar symptoms in the past.  She has not tried anything for symptoms.  Patient denies any chest pain, shortness of breath, cough, abdominal pain, flank pain or urinary symptoms.  HPI  Past Medical History:  Diagnosis Date  . Anxiety   . Arthritis   . Depression   . Fatty liver   . GERD (gastroesophageal reflux disease)   . Headache   . History of head injury    childhood  . Multilevel degenerative disc disease   . Pre-syncope   . Sleep apnea    sleeps with bipap    Patient Active Problem List   Diagnosis Date Noted  .  Confusion 12/12/2015  . Abnormal glucose 12/12/2015    Past Surgical History:  Procedure Laterality Date  . BREAST BIOPSY     u/s needle bx ?side-neg  . HYSTEROSCOPY W/D&C N/A 08/10/2015   Procedure: DILATATION AND CURETTAGE /HYSTEROSCOPY;  Surgeon: Benjaman Kindler, MD;  Location: [facility] ORS;  Service: Gynecology;  Laterality: N/A;  . LAPAROSCOPIC OVARIAN CYSTECTOMY    . TONSILLECTOMY       OB History   No obstetric history on file.      Home Medications    Prior to Admission medications   Medication Sig Start Date End Date Taking? Authorizing Provider  amLODipine (NORVASC) 5 MG tablet Take 5 mg by mouth daily.    [provider]  hydrOXYzine (ATARAX/VISTARIL) 25 MG tablet Take 1 tablet (25 mg total) every 8 (eight) hours as needed by mouth for anxiety. 08/26/17   Harvest Dark, MD    Family History Family History  Problem Relation Age of Onset  . Alzheimer's disease Mother   . Colon cancer Father   . Rectal cancer Father   . Heart disease Father   . Diabetes Father   . Hypertension Father   . Heart disease Maternal Grandmother   . Diabetes Maternal Grandmother   . Stomach cancer Maternal Grandmother   . Heart disease Paternal Grandmother   . Heart disease Paternal Grandfather   . Diabetes Paternal Grandfather   . Hypertension Paternal Grandfather   .  Alcoholism Maternal Grandfather   . Breast cancer Neg Hx     Social History Social History   Tobacco Use  . Smoking status: Never Smoker  . Smokeless tobacco: Never Used  Substance Use Topics  . Alcohol use: Yes    Alcohol/week: 0.0 standard drinks    Comment: Social use  . Drug use: No     Allergies   Latex   Review of Systems Review of Systems  All other systems reviewed and are negative.    Physical Exam Updated Vital Signs BP (!) 175/111   Pulse 82   Temp 98.5 F (36.9 C) (Oral)   Resp 18   Ht 5\' 7"  (1.702 m)   Wt 95.3 kg   SpO2 98%   BMI 32.89 kg/m   Physical  Exam Vitals signs and nursing note reviewed.  Constitutional:      Appearance: She is well-developed. She is not diaphoretic.  HENT:     Head: Normocephalic and atraumatic.     Right Ear: External ear normal.     Left Ear: External ear normal.     Nose: Nose normal.     Mouth/Throat:     Pharynx: Uvula midline.     Tonsils: No tonsillar exudate.  Eyes:     General: No scleral icterus.       Right eye: No discharge.        Left eye: No discharge.     Pupils: Pupils are equal, round, and reactive to light.     Comments: No vertical or rotary nystagmus.  Patient with 1-2 beat horizontal nystagmus.  Neck:     Musculoskeletal: Neck supple. Normal range of motion. No neck rigidity or spinous process tenderness.     Trachea: Trachea normal.  Cardiovascular:     Rate and Rhythm: Normal rate and regular rhythm.     Pulses:          Radial pulses are 2+ on the right side and 2+ on the left side.       Dorsalis pedis pulses are 2+ on the right side and 2+ on the left side.       Posterior tibial pulses are 2+ on the right side and 2+ on the left side.     Heart sounds: No murmur.     Comments: No lower extremity swelling or edema. Calves symmetric in size bilaterally. Pulmonary:     Effort: Pulmonary effort is normal.     Breath sounds: Normal breath sounds.  Chest:     Chest wall: No tenderness.  Abdominal:     General: Bowel sounds are normal.     Palpations: Abdomen is soft.     Tenderness: There is no abdominal tenderness. There is no guarding or rebound.  Lymphadenopathy:     Cervical: No cervical adenopathy.  Skin:    General: Skin is warm and dry.     Findings: No rash.  Neurological:     Mental Status: She is alert.     Comments: Mental Status:  Alert, oriented, thought content appropriate, able to give a coherent history. Speech fluent without evidence of aphasia. Able to follow 2 step commands without difficulty.  Cranial Nerves:  II:  Peripheral visual fields grossly  normal, pupils equal, round, reactive to light III,IV, VI: ptosis not present, extra-ocular motions intact bilaterally  V,VII: smile symmetric, eyebrows raise symmetric, facial light touch sensation equal VIII: hearing grossly normal to voice  X: uvula elevates symmetrically  XI: bilateral shoulder  shrug symmetric and strong XII: midline tongue extension without fassiculations Motor:  Normal tone. 5/5 in upper and lower extremities bilaterally including strong and equal grip strength and dorsiflexion/plantar flexion Sensory: Sensation intact to light touch in all extremities. Negative Romberg.  Deep Tendon Reflexes: 2+ and symmetric in the biceps and patella Cerebellar: normal finger-to-nose with bilateral upper extremities. Normal heel-to -shin balance bilaterally of the lower extremity. No pronator drift.  Gait: normal gait and balance CV: distal pulses palpable throughout       ED Treatments / Results  Labs (all labs ordered are listed, but only abnormal results are displayed) Labs Reviewed  BASIC METABOLIC PANEL - Abnormal; Notable for the following components:      Result Value   Potassium 3.3 (*)    Glucose, Bld 122 (*)    All other components within normal limits  CBC WITH DIFFERENTIAL/PLATELET - Abnormal; Notable for the following components:   RBC 5.33 (*)    All other components within normal limits    EKG EKG Interpretation  Date/Time:  Friday October 08 2018 14:54:25 EST Ventricular Rate:  81 PR Interval:  146 QRS Duration: 86 QT Interval:  372 QTC Calculation: 432 R Axis:   11 Text Interpretation:  Normal sinus rhythm Cannot rule out Anterior infarct , age undetermined Abnormal ECG Confirmed by Virgel Manifold [phone]) on 10/08/2018 4:00:37 PM   Radiology No results found.  Procedures Procedures (including critical care time)  Medications Ordered in ED Medications  ondansetron (ZOFRAN-ODT) disintegrating tablet 4 mg (has no administration in time  range)  meclizine (ANTIVERT) tablet 25 mg (has no administration in time range)     Initial Impression / Assessment and Plan / ED Course  I have reviewed the triage vital signs and the nursing notes.  Pertinent labs & imaging results that were available during my care of the patient were reviewed by me and considered in my medical decision making (see chart for details).     57 y.o. female with vertigo brought on by movement that is reproducible, lasts for <5-51min and is not associated with other neurologic symptoms. Patient with unilateral nystagmus, negative skew test and normal neurologic exam.  No vertical or rotational nystagmus. Patient normal finger-nose and normal gait.  No slurred speech renal or weakness.  Doubt CVA or other central cause of vertigo.  History and physical consistent with peripheral vertigo symptoms.  We'll discharge home with meclizine. Patient instructed to followup with her primary care physician or neurology within 3 days for further evaluation. They are to return to the emergency department for new neurologic symptoms, loss of vision or other concerning symptoms. Patient case discussed with Dr. Wilson Singer who is in agreement with plan.  Final Clinical Impressions(s) / ED Diagnoses   Final diagnoses:  Dizziness    ED Discharge Orders         Ordered    meclizine (ANTIVERT) 12.5 MG tablet  3 times daily PRN     10/08/18 1747           Lorelle Gibbs 10/08/18 1748    Virgel Manifold, MD 10/08/18 1815

## 2018-10-20 ENCOUNTER — Other Ambulatory Visit (HOSPITAL_BASED_OUTPATIENT_CLINIC_OR_DEPARTMENT_OTHER): Payer: Self-pay | Admitting: Otolaryngology

## 2018-10-20 DIAGNOSIS — R1110 Vomiting, unspecified: Secondary | ICD-10-CM

## 2018-10-20 DIAGNOSIS — R739 Hyperglycemia, unspecified: Secondary | ICD-10-CM

## 2018-10-20 DIAGNOSIS — H8110 Benign paroxysmal vertigo, unspecified ear: Secondary | ICD-10-CM

## 2018-10-23 ENCOUNTER — Ambulatory Visit (HOSPITAL_BASED_OUTPATIENT_CLINIC_OR_DEPARTMENT_OTHER)
Admission: RE | Admit: 2018-10-23 | Discharge: 2018-10-23 | Disposition: A | Discharge status: Home / Self Care | Payer: BLUE CROSS/BLUE SHIELD | Source: Ambulatory Visit | Attending: Otolaryngology | Admitting: Otolaryngology

## 2018-10-23 DIAGNOSIS — R739 Hyperglycemia, unspecified: Secondary | ICD-10-CM | POA: Diagnosis present

## 2018-10-23 DIAGNOSIS — R1110 Vomiting, unspecified: Secondary | ICD-10-CM | POA: Diagnosis present

## 2018-10-23 DIAGNOSIS — H8110 Benign paroxysmal vertigo, unspecified ear: Secondary | ICD-10-CM | POA: Diagnosis not present

## 2018-10-23 MED ORDER — GADOBUTROL 1 MMOL/ML IV SOLN
9.5000 mL | Freq: Once | INTRAVENOUS | Status: AC | PRN
Start: 2018-10-23 — End: 2018-10-23
  Administered 2018-10-23: 9.5 mL via INTRAVENOUS

## 2019-03-16 IMAGING — MR MR HEAD WO/W CM
10 series · 48 of 48 positions shown · IV contrast (gadavist)
Comparison: 11/21/2015 CT head.

CLINICAL DATA: 57 y/o F; episode of dizziness and vomiting on
10/04/2018. 3 year history of intermittent pre syncopal events.

EXAM:
MRI HEAD WITHOUT AND WITH CONTRAST
TECHNIQUE: Multiplanar, multiecho pulse sequences of the brain and surrounding
structures were obtained without and with intravenous contrast.
CONTRAST:  9.5 cc Gadavist

[Series 2: T1 · sagittal · 5.0mm · 0.86mm/px · 1 of 27 slices shown (1 of 2)]
[im 1/27]
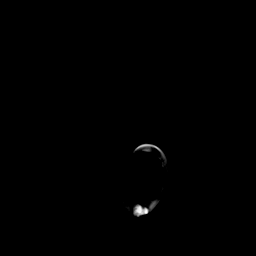

[Series 3: DWI · axial · 3.0mm · 1.88mm/px · z∈[-51,+104]mm · 9 of 96 slices shown (1 of 2)]
[im 1/96]
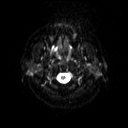
[im 12/96]
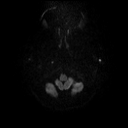
[im 24/96]
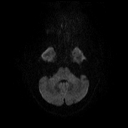
[im 36/96]
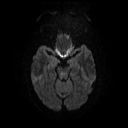
[im 48/96]
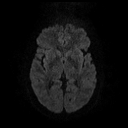
[im 60/96]
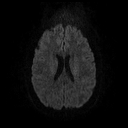
[im 72/96]
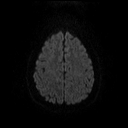
[im 84/96]
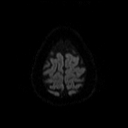
[im 96/96]
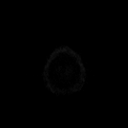

[Series 4: DWI · axial · 3.0mm · 1.88mm/px · z∈[-51,+104]mm · 4 of 48 slices shown (2 of 2)]
[im 1/48]
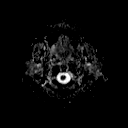
[im 16/48]
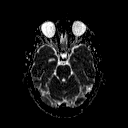
[im 32/48]
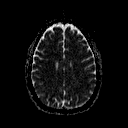
[im 48/48]
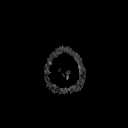

[Series 5: T2 · axial · 5.0mm · 0.69mm/px · z∈[-55,+106]mm · 3 of 28 slices shown (1 of 3)]
[im 1/28]
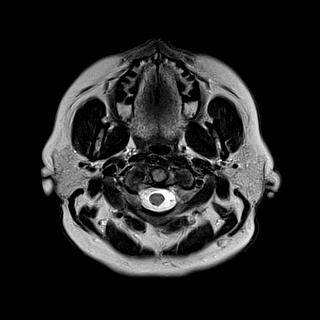
[im 14/28]
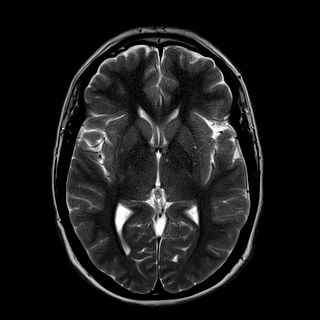
[im 28/28]
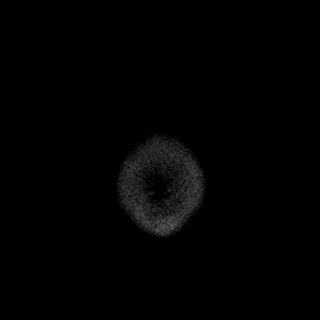

[Series 6: T2 · axial · 5.0mm · 0.43mm/px · z∈[-55,+106]mm · 3 of 28 slices shown (2 of 3)]
[im 1/28]
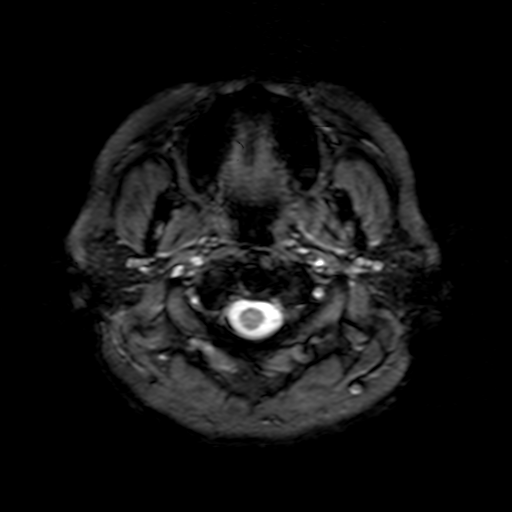
[im 14/28]
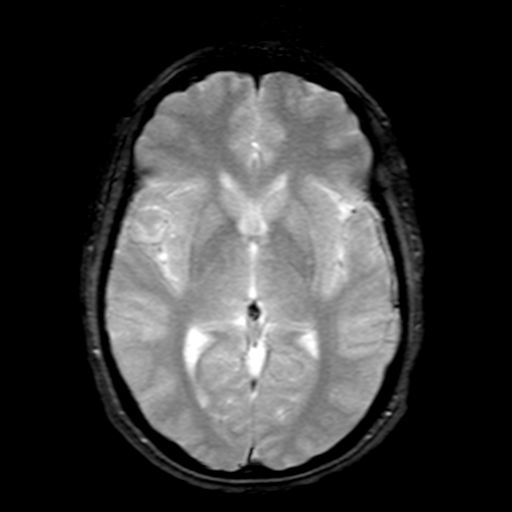
[im 28/28]
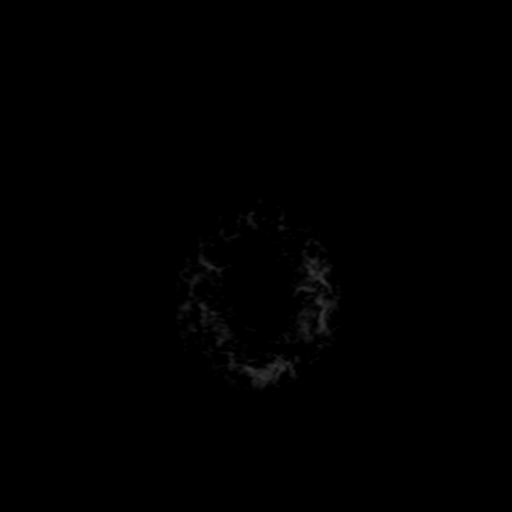

[Series 7: FLAIR · axial · 3.0mm · 0.43mm/px · z∈[-56,+107]mm · 4 of 42 slices shown]
[im 1/42]
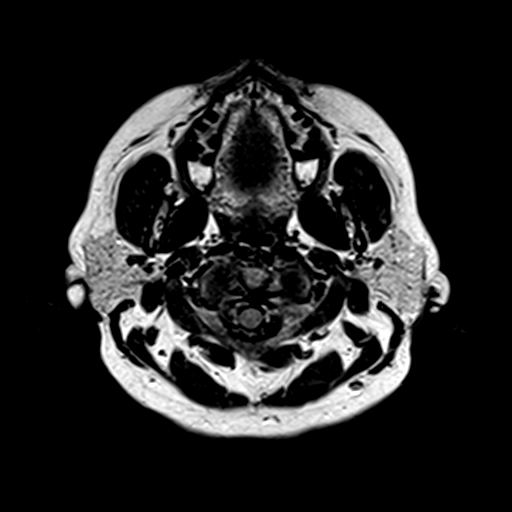
[im 14/42]
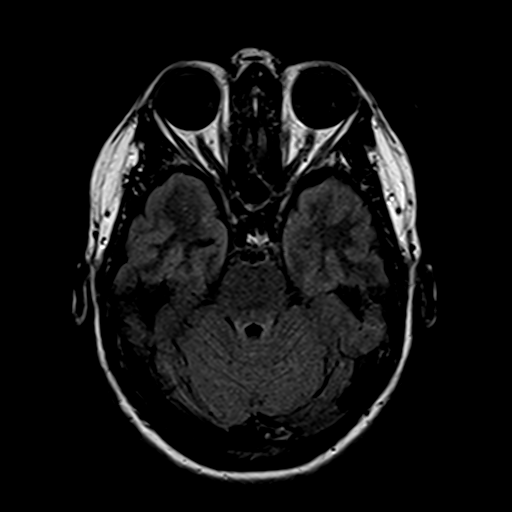
[im 28/42]
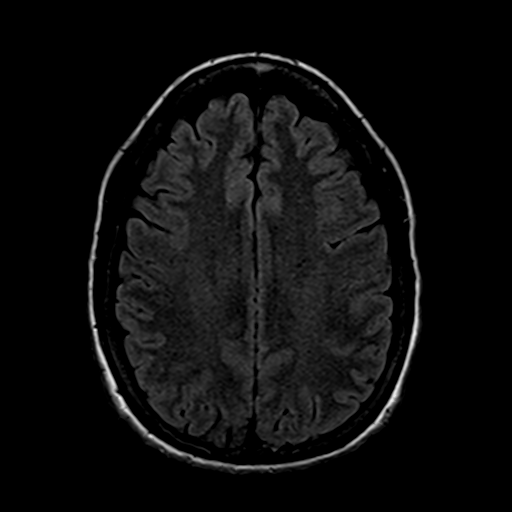
[im 42/42]
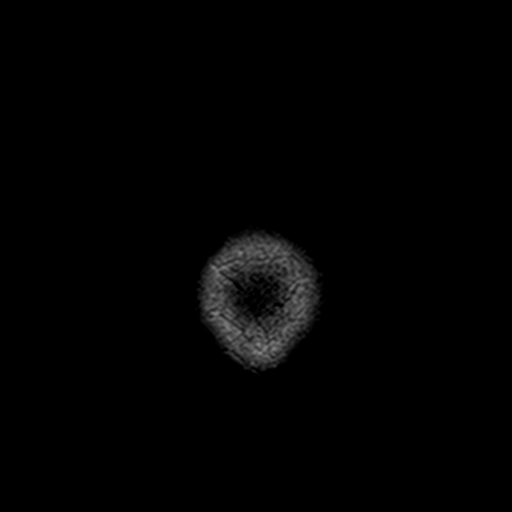

[Series 8: t1_3d_tra · axial · 2.0mm · 0.88mm/px · z∈[-67,+123]mm · 9 of 96 slices shown]
[im 1/96]
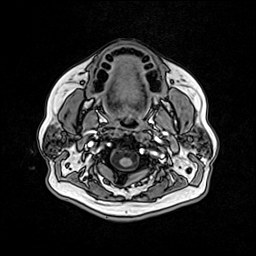
[im 12/96]
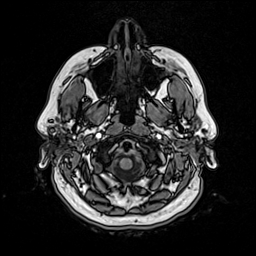
[im 24/96]
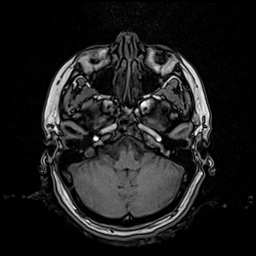
[im 36/96]
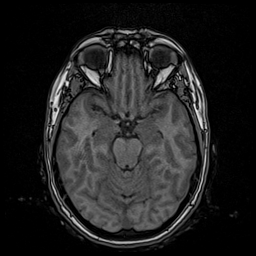
[im 48/96]
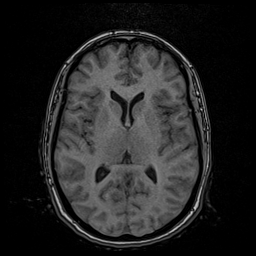
[im 60/96]
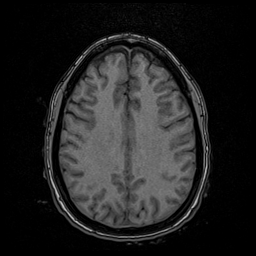
[im 72/96]
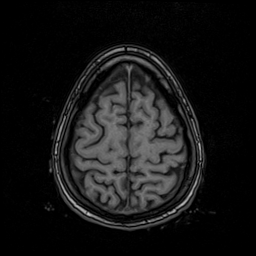
[im 84/96]
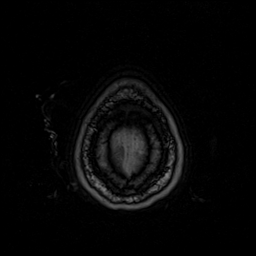
[im 96/96]
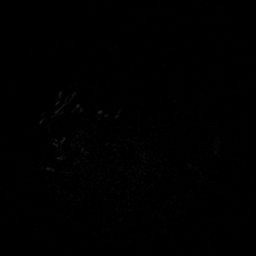

[Series 10: T2 · coronal · 5.0mm · 0.69mm/px · 3 of 30 slices shown (3 of 3)]
[im 1/30]
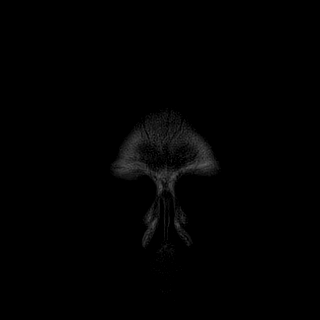
[im 15/30]
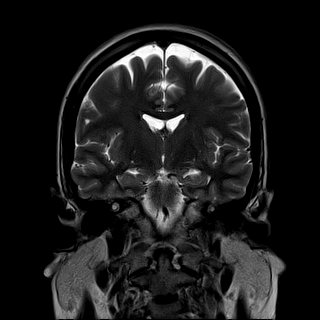
[im 30/30]
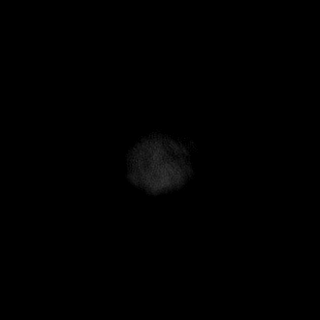

[Series 11: t1_3d_tra +c · axial · 2.0mm · 0.88mm/px · z∈[-67,+123]mm · 9 of 96 slices shown]
[im 1/96]
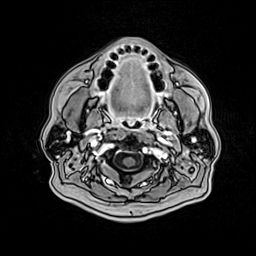
[im 12/96]
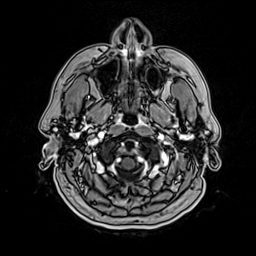
[im 24/96]
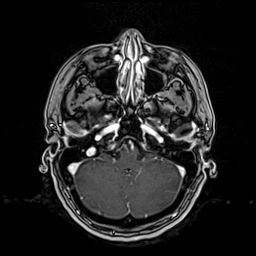
[im 36/96]
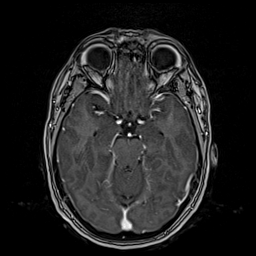
[im 48/96]
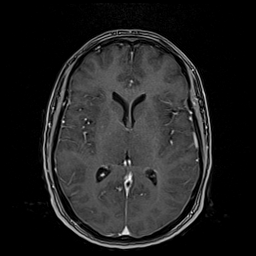
[im 60/96]
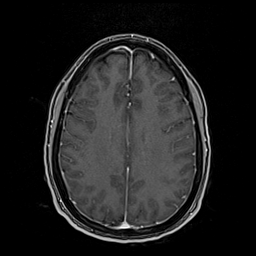
[im 72/96]
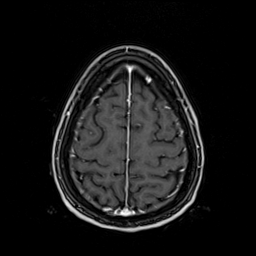
[im 84/96]
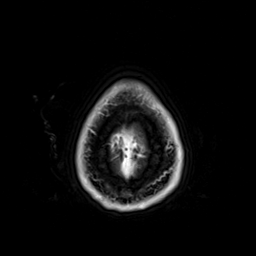
[im 96/96]
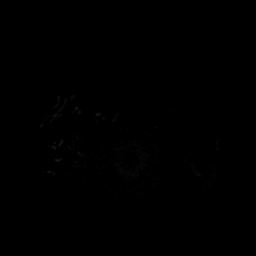

[Series 12: T1 · coronal · 5.0mm · 0.86mm/px · 3 of 30 slices shown (2 of 2)]
[im 1/30]
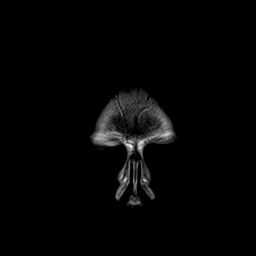
[im 15/30]
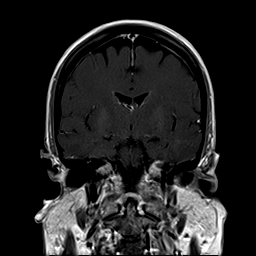
[im 30/30]
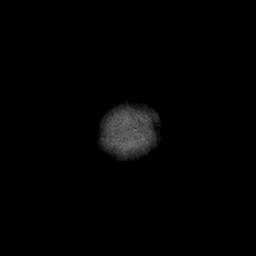

[48 of 48 positions shown; findings below may reference images not displayed]

FINDINGS: Brain: No acute infarction, hemorrhage, hydrocephalus, extra-axial
collection or mass lesion. After administration of intravenous
contrast there is no abnormal enhancement of the brain.

Vascular: Normal flow voids.

Skull and upper cervical spine: Normal marrow signal.

Sinuses/Orbits: Negative.

Other: None.
IMPRESSION: No acute process or abnormal enhancement identified. Unremarkable
MRI of the brain.

## 2022-03-31 ENCOUNTER — Other Ambulatory Visit: Payer: Self-pay | Admitting: *Deleted

## 2022-03-31 DIAGNOSIS — I10 Essential (primary) hypertension: Secondary | ICD-10-CM

## 2022-03-31 DIAGNOSIS — E785 Hyperlipidemia, unspecified: Secondary | ICD-10-CM

## 2022-03-31 DIAGNOSIS — Z9189 Other specified personal risk factors, not elsewhere classified: Secondary | ICD-10-CM

## 2022-06-30 ENCOUNTER — Other Ambulatory Visit: Payer: BLUE CROSS/BLUE SHIELD

## 2022-07-21 ENCOUNTER — Ambulatory Visit: Admission: RE | Admit: 2022-07-21 | Payer: Self-pay | Source: Ambulatory Visit

## 2022-07-28 ENCOUNTER — Ambulatory Visit
Admission: RE | Admit: 2022-07-28 | Discharge: 2022-07-28 | Disposition: A | Discharge status: Home / Self Care | Payer: No Typology Code available for payment source | Source: Ambulatory Visit | Attending: *Deleted | Admitting: *Deleted

## 2022-07-28 DIAGNOSIS — I10 Essential (primary) hypertension: Secondary | ICD-10-CM

## 2022-07-28 DIAGNOSIS — Z9189 Other specified personal risk factors, not elsewhere classified: Secondary | ICD-10-CM

## 2022-07-28 DIAGNOSIS — E785 Hyperlipidemia, unspecified: Secondary | ICD-10-CM
